# Patient Record
Sex: Male | Born: 1990 | Race: White | Hispanic: No | Marital: Married | Smoking: Never smoker
Health system: Southern US, Community
[De-identification: ages and names within clinical notes are randomized; demographics above are authoritative.]

## PROBLEM LIST (undated history)

## (undated) DIAGNOSIS — F909 Attention-deficit hyperactivity disorder, unspecified type: Secondary | ICD-10-CM

## (undated) DIAGNOSIS — G43909 Migraine, unspecified, not intractable, without status migrainosus: Secondary | ICD-10-CM

## (undated) HISTORY — DX: Migraine, unspecified, not intractable, without status migrainosus: G43.909

## (undated) HISTORY — DX: Attention-deficit hyperactivity disorder, unspecified type: F90.9

---

## 2014-06-01 ENCOUNTER — Ambulatory Visit (INDEPENDENT_AMBULATORY_CARE_PROVIDER_SITE_OTHER): Payer: BC Managed Care – PPO | Admitting: Family

## 2014-06-01 ENCOUNTER — Encounter: Payer: Self-pay | Admitting: Family

## 2014-06-01 VITALS — BP 110/82 | HR 69 | Temp 97.7°F | Resp 18 | Ht 75.0 in | Wt 252.8 lb

## 2014-06-01 DIAGNOSIS — F411 Generalized anxiety disorder: Secondary | ICD-10-CM

## 2014-06-01 DIAGNOSIS — F909 Attention-deficit hyperactivity disorder, unspecified type: Secondary | ICD-10-CM

## 2014-06-01 MED ORDER — AMPHETAMINE-DEXTROAMPHETAMINE 20 MG PO TABS: 20.0000 mg | ORAL_TABLET | Freq: Two times a day (BID) | ORAL | Status: DC

## 2014-06-01 NOTE — Assessment & Plan Note (Signed)
Symptoms described by patient consistent with anxiety. Has used several coping mechanisms up to this point. Discussed continuation of lifestyle management and stress reduction. Provided overview of medications for anxiety and potential side effects. Pt will research and decide at next visit. Follow up as needed.

## 2014-06-01 NOTE — Assessment & Plan Note (Addendum)
Discussed risks and benefits of restarting Adderall. Pt wishes to continue. Start Adderall. Discussed controlled substance contract and need to follow up at least every 3 months. Pt in agreement.

## 2014-06-01 NOTE — Progress Notes (Signed)
   Subjective:    Patient ID: Jerome CruzJonathan Brenning, male    DOB: 1991-06-19, 23 y.o.   MRN: [id number]  Chief Complaint  Patient presents with  . Establish Care    ADHD meds refilled?    HPI:  Jerome CruzJonathan Isaacson is a 23 y.o. male who presents today to establish care and discuss ADHD and anxiety   1) ADHD - Diagnosed as a child and reconfirmed in 2011. Previously taking Adderall. Has not taken medication for about 2 years. Has not noticed any additional increase in attention, able to perform work duties without problems. Now looking to go back to school and would like to restart medication for focus. Denies chest pain, shortness of breath or related symptoms. Sleeping and eating well.   2) Anxiety - Both parents have anxiety. Notices father gets anxious about everything. Used to be that way and has learned to cope. Now chews on nails, and when faced with new situations and begins to spiral and does not think straight and either gives up or goes and eat. Has learned to cope by going with the flow. Has some concerns with loss of control.   No Known Allergies  No current outpatient prescriptions on file prior to visit.   No current facility-administered medications on file prior to visit.   Past Medical History  Diagnosis Date  . Migraines   . ADHD (attention deficit hyperactivity disorder)     Diagnosed in 2011    Review of Systems    See HPI   Objective:    BP 110/82 mmHg  Pulse 69  Temp(Src) 97.7 F (36.5 C) (Oral)  Resp 18  Ht 6\' 3"  (1.905 m)  Wt 252 lb 12.8 oz (114.669 kg)  BMI 31.60 kg/m2  SpO2 96% Nursing note and vital signs reviewed.  Physical Exam  Constitutional: He is oriented to person, place, and time. He appears well-developed and well-nourished. No distress.  Cardiovascular: Normal rate, regular rhythm, normal heart sounds and intact distal pulses.   Pulmonary/Chest: Effort normal and breath sounds normal.  Neurological: He is alert and oriented to person, place,  and time.  Skin: Skin is warm and dry.  Psychiatric: He has a normal mood and affect. His behavior is normal. Judgment and thought content normal.       Assessment & Plan:

## 2014-06-01 NOTE — Progress Notes (Signed)
Pre visit review using our clinic review tool, if applicable. No additional management support is needed unless otherwise documented below in the visit note. 

## 2014-06-01 NOTE — Patient Instructions (Signed)
Thank you for choosing [facility].  Summary/Instructions:   Please plan to follow up in about 1 month for a f/u   Please make a time for your physical.  Please research the antidepressant medications and their respected side effects.   Amphetamine; Dextroamphetamine tablets What is this medicine? AMPHETAMINE; DEXTROAMPHETAMINE(am FET a meen; dex troe am FET a meen) is used to treat attention-deficit hyperactivity disorder (ADHD). It may also be used for narcolepsy. Federal law prohibits giving this medicine to any person other than the person for whom it was prescribed. Do not share this medicine with anyone else. This medicine may be used for other purposes; ask your health care provider or pharmacist if you have questions. COMMON BRAND NAME(S): Adderall What should I tell my health care provider before I take this medicine? They need to know if you have any of these conditions: -anxiety or panic attacks -circulation problems in fingers and toes -glaucoma -hardening or blockages of the arteries or heart blood vessels -heart disease or a heart defect -high blood pressure -history of a drug or alcohol abuse problem -history of stroke -kidney disease -liver disease -mental illness -seizures -suicidal thoughts, plans, or attempt; a previous suicide attempt by you or a family member -thyroid disease -Tourette's syndrome -an unusual or allergic reaction to dextroamphetamine, other amphetamines, other medicines, foods, dyes, or preservatives -pregnant or trying to get pregnant -breast-feeding How should I use this medicine? Take this medicine by mouth with a glass of water. Follow the directions on the prescription label. Take your doses at regular intervals. Do not take your medicine more often than directed. Do not suddenly stop your medicine. You must gradually reduce the dose or you may feel withdrawal effects. Ask your doctor or health care professional for advice. Talk  to your pediatrician regarding the use of this medicine in children. Special care may be needed. While this drug may be prescribed for children as young as 3 years for selected conditions, precautions do apply. Overdosage: If you think you have taken too much of this medicine contact a poison control center or emergency room at once. NOTE: This medicine is only for you. Do not share this medicine with others. What if I miss a dose? If you miss a dose, take it as soon as you can. If it is almost time for your next dose, take only that dose. Do not take double or extra doses. What may interact with this medicine? Do not take this medicine with any of the following medications: -certain medicines for migraine headache like almotriptan, eletriptan, frovatriptan, naratriptan, rizatriptan, sumatriptan, zolmitriptan -MAOIS like Carbex, Eldepryl, Marplan, Nardil, and Parnate -meperidine -other stimulant medicines for attention disorders, weight loss, or to stay awake -pimozide -procarbazine This medicine may also interact with the following medications: -acetazolamide -ammonium chloride -antacids -ascorbic acid -atomoxetine -caffeine -certain medicines for blood pressure -certain medicines for depression, anxiety, or psychotic disturbances -certain medicines for seizures like carbamazepine, phenobarbital, phenytoin -certain medicines for stomach problems like cimetidine, famotidine, omeprazole, lansoprazole -cold or allergy medicines -glutamic acid -methenamine -narcotic medicines for pain -norepinephrine -phenothiazines like chlorpromazine, mesoridazine, prochlorperazine, thioridazine -sodium acid phosphate -sodium bicarbonate This list may not describe all possible interactions. Give your health care provider a list of all the medicines, herbs, non-prescription drugs, or dietary supplements you use. Also tell them if you smoke, drink alcohol, or use illegal drugs. Some items may interact  with your medicine. What should I watch for while using this medicine? Visit your doctor or health  care professional for regular checks on your progress. This prescription requires that you follow special procedures with your doctor and pharmacy. You will need to have a new written prescription from your doctor every time you need a refill. This medicine may affect your concentration, or hide signs of tiredness. Until you know how this medicine affects you, do not drive, ride a bicycle, use machinery, or do anything that needs mental alertness. Tell your doctor or health care professional if this medicine loses its effects, or if you feel you need to take more than the prescribed amount. Do not change the dosage without talking to your doctor or health care professional. Decreased appetite is a common side effect when starting this medicine. Eating small, frequent meals or snacks can help. Talk to your doctor if you continue to have poor eating habits. Height and weight growth of a child taking this medicine will be monitored closely. Do not take this medicine close to bedtime. It may prevent you from sleeping. If you are going to need surgery, a MRI, CT scan, or other procedure, tell your doctor that you are taking this medicine. You may need to stop taking this medicine before the procedure. Tell your doctor or healthcare professional right away if you notice unexplained wounds on your fingers and toes while taking this medicine. You should also tell your healthcare provider if you experience numbness or pain, changes in the skin color, or sensitivity to temperature in your fingers or toes. What side effects may I notice from receiving this medicine? Side effects that you should report to your doctor or health care professional as soon as possible: -allergic reactions like skin rash, itching or hives, swelling of the face, lips, or tongue -changes in vision -chest pain or chest tightness -confusion,  trouble speaking or understanding -fast, irregular heartbeat -fingers or toes feel numb, cool, painful -hallucination, loss of contact with reality -high blood pressure -males: prolonged or painful erection -seizures -severe headaches -shortness of breath -suicidal thoughts or other mood changes -trouble walking, dizziness, loss of balance or coordination -uncontrollable head, mouth, neck, arm, or leg movements Side effects that usually do not require medical attention (report to your doctor or health care professional if they continue or are bothersome): -anxious -headache -loss of appetite -nausea, vomiting -trouble sleeping -weight loss This list may not describe all possible side effects. Call your doctor for medical advice about side effects. You may report side effects to FDA at 1-800-FDA-1088. Where should I keep my medicine? Keep out of the reach of children. This medicine can be abused. Keep your medicine in a safe place to protect it from theft. Do not share this medicine with anyone. Selling or giving away this medicine is dangerous and against the law. Store at room temperature between 15 and 30 degrees C (59 and 86 degrees F). Keep container tightly closed. Throw away any unused medicine after the expiration date. NOTE: This sheet is a summary. It may not cover all possible information. If you have questions about this medicine, talk to your doctor, pharmacist, or health care provider.  2015, Elsevier/Gold Standard. (2013-05-27 18:16:55)

## 2014-08-09 ENCOUNTER — Encounter: Payer: Self-pay | Admitting: Family

## 2014-08-09 ENCOUNTER — Other Ambulatory Visit (INDEPENDENT_AMBULATORY_CARE_PROVIDER_SITE_OTHER): Payer: BLUE CROSS/BLUE SHIELD

## 2014-08-09 ENCOUNTER — Ambulatory Visit (INDEPENDENT_AMBULATORY_CARE_PROVIDER_SITE_OTHER): Payer: BLUE CROSS/BLUE SHIELD | Admitting: Family

## 2014-08-09 VITALS — BP 120/82 | HR 74 | Temp 97.4°F | Resp 18 | Ht 75.0 in | Wt 251.0 lb

## 2014-08-09 DIAGNOSIS — Z0000 Encounter for general adult medical examination without abnormal findings: Secondary | ICD-10-CM

## 2014-08-09 DIAGNOSIS — F909 Attention-deficit hyperactivity disorder, unspecified type: Secondary | ICD-10-CM

## 2014-08-09 LAB — CBC
HCT: 48.4 % (ref 39.0–52.0)
Hemoglobin: 16.3 g/dL (ref 13.0–17.0)
MCHC: 33.6 g/dL (ref 30.0–36.0)
MCV: 87.9 fl (ref 78.0–100.0)
Platelets: 302 10*3/uL (ref 150.0–400.0)
RBC: 5.5 Mil/uL (ref 4.22–5.81)
RDW: 13 % (ref 11.5–15.5)
WBC: 5.5 10*3/uL (ref 4.0–10.5)

## 2014-08-09 LAB — TSH: TSH: 1.37 u[IU]/mL (ref 0.35–4.50)

## 2014-08-09 LAB — BASIC METABOLIC PANEL
BUN: 14 mg/dL (ref 6–23)
CO2: 26 meq/L (ref 19–32)
CREATININE: 1 mg/dL (ref 0.40–1.50)
Calcium: 9.6 mg/dL (ref 8.4–10.5)
Chloride: 104 mEq/L (ref 96–112)
GFR: 97.39 mL/min (ref >60.00)
Glucose, Bld: 91 mg/dL (ref 70–99)
Potassium: 4.3 mEq/L (ref 3.5–5.1)
SODIUM: 140 meq/L (ref 135–145)

## 2014-08-09 LAB — LIPID PANEL
Cholesterol: 160 mg/dL (ref 0–200)
HDL: 45.9 mg/dL (ref >39.00)
LDL Cholesterol: 99 mg/dL (ref 0–99)
NonHDL: 114.1
Total CHOL/HDL Ratio: 3
Triglycerides: 74 mg/dL (ref 0.0–149.0)
VLDL: 14.8 mg/dL (ref 0.0–40.0)

## 2014-08-09 MED ORDER — AMPHETAMINE-DEXTROAMPHETAMINE 20 MG PO TABS
20.0000 mg | ORAL_TABLET | Freq: Two times a day (BID) | ORAL | Status: DC
Start: 2014-08-09 — End: 2014-10-11

## 2014-08-09 MED ORDER — DULOXETINE HCL 30 MG PO CPEP: 30.0000 mg | ORAL_CAPSULE | Freq: Every day | ORAL | Status: DC

## 2014-08-09 NOTE — Progress Notes (Signed)
Subjective:    Patient ID: Jerome Ramos, male    DOB: 1991-06-16, 24 y.o.   MRN: [id number]  Chief Complaint  Patient presents with  . CPE    not fasting    HPI:  Jerome Ramos is a 24 y.o. male who presents today for an annual wellness visit.   1) Health Maintenance - Indicates he feels good and has been working out more.   Diet - Eating 2-3 meals per day which varies; Indicates he eats a variety fruits, vegetables and lean meats.   Exercise - Has been resistance training and cross-fit like exercise; Averaging about 6 times per week for about an hour at a time.   2) Preventative Exams / Immunizations:  Dental -- Due for exam  Vision -- Up to date  Health Maintenance  Topic Date Due  . INFLUENZA VACCINE  02/25/2014  . TETANUS/TDAP  08/09/2018    There is no immunization history on file for this patient.  No Known Allergies  Current Outpatient Prescriptions on File Prior to Visit  Medication Sig Dispense Refill  . amphetamine-dextroamphetamine (ADDERALL) 20 MG tablet Take 1 tablet (20 mg total) by mouth 2 (two) times daily. 60 tablet 0   No current facility-administered medications on file prior to visit.    Past Medical History  Diagnosis Date  . Migraines   . ADHD (attention deficit hyperactivity disorder)     Diagnosed in 2011     Family History  Problem Relation Age of Onset  . Diabetes Mother     History   Social History  . Marital Status: Married    Spouse Name: N/A    Number of Children: 0  . Years of Education: 13   Occupational History  . Sales Specialist    Social History Main Topics  . Smoking status: Never Smoker   . Smokeless tobacco: Never Used  . Alcohol Use: 0.0 oz/week    0 Not specified per week     Comment: Occasional  . Drug Use: No  . Sexual Activity: Yes    Birth Control/ Protection: Inserts   Other Topics Concern  . Not on file   Social History Narrative   [place] in [place] Currently renting a house  with his wife [name]). 3 cats. Fun: Play games, workout, watch TV.    Denies religious beliefs that would effect healthcare.          Review of Systems  Constitutional: Denies fever, chills, fatigue, or significant weight gain/loss. HENT: Head: Denies headache or neck pain Ears: Denies changes in hearing, ringing in ears, earache, drainage Nose: Denies discharge, stuffiness, itching, nosebleed, sinus pain Throat: Denies sore throat, hoarseness, dry mouth, sores, thrush Eyes: Denies loss/changes in vision, pain, redness, blurry/double vision, flashing lights Cardiovascular: Denies chest pain/discomfort, tightness, palpitations, shortness of breath with activity, difficulty lying down, swelling, sudden awakening with shortness of breath Respiratory: Denies shortness of breath, cough, sputum production, wheezing Gastrointestinal: Denies dysphasia, heartburn, change in appetite, nausea, change in bowel habits, rectal bleeding, constipation, diarrhea, yellow skin or eyes Genitourinary: Denies frequency, urgency, burning/pain, blood in urine, incontinence, change in urinary strength. Musculoskeletal: Denies muscle/joint pain, stiffness, back pain, redness or swelling of joints, trauma Skin: Denies rashes, lumps, itching, dryness, color changes, or hair/nail changes Neurological: Denies dizziness, fainting, seizures, weakness, numbness, tingling, tremor Psychiatric - Denies nervousness, stress, depression or memory loss Endocrine: Denies heat or cold intolerance, sweating, frequent urination, excessive thirst, changes in appetite Hematologic: Denies ease  of bruising or bleeding     Objective:    BP 120/82 mmHg  Pulse 74  Temp(Src) 97.4 F (36.3 C) (Oral)  Resp 18  Ht 6\' 3"  (1.905 m)  Wt 251 lb (113.853 kg)  BMI 31.37 kg/m2  SpO2 97% Nursing note and vital signs reviewed.  Physical Exam  Constitutional: He is oriented to person, place, and time. He appears well-developed and  well-nourished.  HENT:  Head: Normocephalic.  Right Ear: Hearing, tympanic membrane, external ear and ear canal normal.  Left Ear: Hearing, tympanic membrane, external ear and ear canal normal.  Nose: Nose normal.  Mouth/Throat: Uvula is midline, oropharynx is clear and moist and mucous membranes are normal.  Eyes: Conjunctivae and EOM are normal. Pupils are equal, round, and reactive to light.  Neck: Neck supple. No JVD present. No tracheal deviation present. No thyromegaly present.  Cardiovascular: Normal rate, regular rhythm, normal heart sounds and intact distal pulses.   Pulmonary/Chest: Effort normal and breath sounds normal.  Abdominal: Soft. Bowel sounds are normal. He exhibits no distension and no mass. There is no tenderness. There is no rebound and no guarding.  Musculoskeletal: Normal range of motion. He exhibits no edema or tenderness.  Lymphadenopathy:    He has no cervical adenopathy.  Neurological: He is alert and oriented to person, place, and time. He has normal reflexes. No cranial nerve deficit. He exhibits normal muscle tone. Coordination normal.  Skin: Skin is warm and dry.  Psychiatric: He has a normal mood and affect. His behavior is normal. Judgment and thought content normal.       Assessment & Plan:

## 2014-08-09 NOTE — Patient Instructions (Addendum)
Thank you for choosing [facility].  Summary/Instructions:   Please stop by the lab on the basement level of the building for your blood work. Your results will be released to MyChart (or called to you) after review, usually within 72 hours after test completion. If any changes need to be made, you will be notified at that same time.  Health Maintenance A healthy lifestyle and preventative care can promote health and wellness.  Maintain regular health, dental, and eye exams.  Eat a healthy diet. Foods like vegetables, fruits, whole grains, low-fat dairy products, and lean protein foods contain the nutrients you need and are low in calories. Decrease your intake of foods high in solid fats, added sugars, and salt. Get information about a proper diet from your health care provider, if necessary.  Regular physical exercise is one of the most important things you can do for your health. Most adults should get at least 150 minutes of moderate-intensity exercise (any activity that increases your heart rate and causes you to sweat) each week. In addition, most adults need muscle-strengthening exercises on 2 or more days a week.   Maintain a healthy weight. The body mass index (BMI) is a screening tool to identify possible weight problems. It provides an estimate of body fat based on height and weight. Your health care provider can find your BMI and can help you achieve or maintain a healthy weight. For males 20 years and older:  A BMI below 18.5 is considered underweight.  A BMI of 18.5 to 24.9 is normal.  A BMI of 25 to 29.9 is considered overweight.  A BMI of 30 and above is considered obese.  Maintain normal blood lipids and cholesterol by exercising and minimizing your intake of saturated fat. Eat a balanced diet with plenty of fruits and vegetables. Blood tests for lipids and cholesterol should begin at age 20 and be repeated every 5 years. If your lipid or cholesterol levels are  high, you are over age 50, or you are at high risk for heart disease, you may need your cholesterol levels checked more frequently.Ongoing high lipid and cholesterol levels should be treated with medicines if diet and exercise are not working.  If you smoke, find out from your health care provider how to quit. If you do not use tobacco, do not start.  Lung cancer screening is recommended for adults aged 55-80 years who are at high risk for developing lung cancer because of a history of smoking. A yearly low-dose CT scan of the lungs is recommended for people who have at least a 30-pack-year history of smoking and are current smokers or have quit within the past 15 years. A pack year of smoking is smoking an average of 1 pack of cigarettes a day for 1 year (for example, a 30-pack-year history of smoking could mean smoking 1 pack a day for 30 years or 2 packs a day for 15 years). Yearly screening should continue until the smoker has stopped smoking for at least 15 years. Yearly screening should be stopped for people who develop a health problem that would prevent them from having lung cancer treatment.  If you choose to drink alcohol, do not have more than 2 drinks per day. One drink is considered to be 12 oz (360 mL) of beer, 5 oz (150 mL) of wine, or 1.5 oz (45 mL) of liquor.  Avoid the use of street drugs. Do not share needles with anyone. Ask for help if you need   support or instructions about stopping the use of drugs.  High blood pressure causes heart disease and increases the risk of stroke. Blood pressure should be checked at least every 1-2 years. Ongoing high blood pressure should be treated with medicines if weight loss and exercise are not effective.  If you are 45-79 years old, ask your health care provider if you should take aspirin to prevent heart disease.  Diabetes screening involves taking a blood sample to check your fasting blood sugar level. This should be done once every 3 years  after age 45 if you are at a normal weight and without risk factors for diabetes. Testing should be considered at a younger age or be carried out more frequently if you are overweight and have at least 1 risk factor for diabetes.  Colorectal cancer can be detected and often prevented. Most routine colorectal cancer screening begins at the age of 50 and continues through age 75. However, your health care provider may recommend screening at an earlier age if you have risk factors for colon cancer. On a yearly basis, your health care provider may provide home test kits to check for hidden blood in the stool. A small camera at the end of a tube may be used to directly examine the colon (sigmoidoscopy or colonoscopy) to detect the earliest forms of colorectal cancer. Talk to your health care provider about this at age 50 when routine screening begins. A direct exam of the colon should be repeated every 5-10 years through age 75, unless early forms of precancerous polyps or small growths are found.  People who are at an increased risk for hepatitis B should be screened for this virus. You are considered at high risk for hepatitis B if:  You were born in a country where hepatitis B occurs often. Talk with your health care provider about which countries are considered high risk.  Your parents were born in a high-risk country and you have not received a shot to protect against hepatitis B (hepatitis B vaccine).  You have HIV or AIDS.  You use needles to inject street drugs.  You live with, or have sex with, someone who has hepatitis B.  You are a man who has sex with other men (MSM).  You get hemodialysis treatment.  You take certain medicines for conditions like cancer, organ transplantation, and autoimmune conditions.  Hepatitis C blood testing is recommended for all people born from 1945 through 1965 and any individual with known risk factors for hepatitis C.  Healthy men should no longer receive  prostate-specific antigen (PSA) blood tests as part of routine cancer screening. Talk to your health care provider about prostate cancer screening.  Testicular cancer screening is not recommended for adolescents or adult males who have no symptoms. Screening includes self-exam, a health care provider exam, and other screening tests. Consult with your health care provider about any symptoms you have or any concerns you have about testicular cancer.  Practice safe sex. Use condoms and avoid high-risk sexual practices to reduce the spread of sexually transmitted infections (STIs).  You should be screened for STIs, including gonorrhea and chlamydia if:  You are sexually active and are younger than 24 years.  You are older than 24 years, and your health care provider tells you that you are at risk for this type of infection.  Your sexual activity has changed since you were last screened, and you are at an increased risk for chlamydia or gonorrhea. Ask your health care   provider if you are at risk.  If you are at risk of being infected with HIV, it is recommended that you take a prescription medicine daily to prevent HIV infection. This is called pre-exposure prophylaxis (PrEP). You are considered at risk if:  You are a man who has sex with other men (MSM).  You are a heterosexual man who is sexually active with multiple partners.  You take drugs by injection.  You are sexually active with a partner who has HIV.  Talk with your health care provider about whether you are at high risk of being infected with HIV. If you choose to begin PrEP, you should first be tested for HIV. You should then be tested every 3 months for as long as you are taking PrEP.  Use sunscreen. Apply sunscreen liberally and repeatedly throughout the day. You should seek shade when your shadow is shorter than you. Protect yourself by wearing long sleeves, pants, a wide-brimmed hat, and sunglasses year round whenever you are  outdoors.  Tell your health care provider of new moles or changes in moles, especially if there is a change in shape or color. Also, tell your health care provider if a mole is larger than the size of a pencil eraser.  A one-time screening for abdominal aortic aneurysm (AAA) and surgical repair of large AAAs by ultrasound is recommended for men aged 65-75 years who are current or former smokers.  Stay current with your vaccines (immunizations). Document Released: 01/10/2008 Document Revised: 07/19/2013 Document Reviewed: 12/09/2010 Exit[employer] Patient Information 2015 [employer]. This information is not intended to replace advice given to you by your health care provider. Make sure you discuss any questions you have with your health care provider.   

## 2014-08-09 NOTE — Progress Notes (Signed)
Pre visit review using our clinic review tool, if applicable. No additional management support is needed unless otherwise documented below in the visit note. 

## 2014-08-09 NOTE — Assessment & Plan Note (Signed)
Patient denies any adverse effects of current Adderall medication. Continue current Adderall prescribed dose. Medication refilled.

## 2014-08-09 NOTE — Assessment & Plan Note (Signed)
1) Anticipatory Guidance: Discussed importance of wearing a seatbelt while driving and not texting while driving; changing batteries in smoke detector at least once annually; wearing suntan lotion when outside; eating a balanced and moderate diet; getting physical activity at least 30 minutes per day.  2) Immunizations / Screenings / Labs:  Patient declined flu shot at this time. All other immunizations are up-to-date per recommendations. I'll screenings are up-to-date per recommendations. Obtain CBC, BMET, Lipid profile and TSH.    Overall well exam. Patient's current risk factors obesity. Patient is currently improving his diet and increasing his physical activity. Goal is for lose approximately 5-10% his current body weight. Continue current healthy lifestyle choices. Follow up prevention exam in one year.

## 2014-08-10 ENCOUNTER — Telehealth: Payer: Self-pay | Admitting: Family

## 2014-08-10 NOTE — Telephone Encounter (Signed)
Tried calling pt. Left message for him to call back.  

## 2014-08-10 NOTE — Telephone Encounter (Signed)
Please inform the patient that his labs are all within the normal limits and everything looks good at this time. Have him keep up the good work!

## 2014-08-11 NOTE — Telephone Encounter (Signed)
Tried calling pt again. No answer. Left VM for him to call back

## 2014-08-14 NOTE — Telephone Encounter (Signed)
Pt aware of result.

## 2014-10-11 ENCOUNTER — Telehealth: Payer: Self-pay | Admitting: Family

## 2014-10-11 MED ORDER — AMPHETAMINE-DEXTROAMPHETAMINE 20 MG PO TABS: 20.0000 mg | ORAL_TABLET | Freq: Two times a day (BID) | ORAL | Status: DC

## 2014-10-11 NOTE — Telephone Encounter (Signed)
Patient will need to be seen for next refill.

## 2014-10-12 NOTE — Telephone Encounter (Signed)
LVM for pt letting them know that rx was ready for pick up.

## 2014-11-15 ENCOUNTER — Telehealth: Payer: Self-pay | Admitting: *Deleted

## 2014-11-15 NOTE — Telephone Encounter (Signed)
Pt never picked up script for Adderal 20 mg dated 10/11/14. Shredded script...Raechel Chute/lmb

## 2014-11-16 ENCOUNTER — Telehealth: Payer: Self-pay | Admitting: Family

## 2014-11-16 NOTE — Telephone Encounter (Signed)
LVM for pt letting him know that he needs an OV in order to get a refill of his adderall.

## 2014-11-16 NOTE — Telephone Encounter (Signed)
He needs an office visit please

## 2014-11-16 NOTE — Telephone Encounter (Signed)
Patient states he requested a refill script of adderall three weeks ago.  He did not get to pick up b/c he went out of town.  He is requesting another refill.  I looked in cabinet.  I did not see a script.  It looks like it has been cleaned out.

## 2014-11-17 ENCOUNTER — Ambulatory Visit: Payer: BLUE CROSS/BLUE SHIELD | Admitting: Family

## 2014-11-17 DIAGNOSIS — Z0289 Encounter for other administrative examinations: Secondary | ICD-10-CM

## 2014-11-28 ENCOUNTER — Ambulatory Visit (INDEPENDENT_AMBULATORY_CARE_PROVIDER_SITE_OTHER): Payer: BLUE CROSS/BLUE SHIELD | Admitting: Family

## 2014-11-28 ENCOUNTER — Encounter: Payer: Self-pay | Admitting: Family

## 2014-11-28 VITALS — BP 102/70 | HR 75 | Temp 97.9°F | Resp 18 | Ht 75.0 in | Wt 231.0 lb

## 2014-11-28 DIAGNOSIS — F909 Attention-deficit hyperactivity disorder, unspecified type: Secondary | ICD-10-CM | POA: Diagnosis not present

## 2014-11-28 MED ORDER — AMPHETAMINE-DEXTROAMPHETAMINE 20 MG PO TABS: 20.0000 mg | ORAL_TABLET | Freq: Two times a day (BID) | ORAL | Status: DC

## 2014-11-28 NOTE — Patient Instructions (Signed)
Thank you for choosing [facility].  Summary/Instructions:  Your prescription(s) have been submitted to your pharmacy or been printed and provided for you. Please take as directed and contact our office if you believe you are having problem(s) with the medication(s) or have any questions.  If your symptoms worsen or fail to improve, please contact our office for further instruction, or in case of emergency go directly to the emergency room at the closest medical facility.     

## 2014-11-28 NOTE — Progress Notes (Signed)
Pre visit review using our clinic review tool, if applicable. No additional management support is needed unless otherwise documented below in the visit note. 

## 2014-11-28 NOTE — Assessment & Plan Note (Signed)
ADHD is stable with current dosage of Adderall. Mild decrease in appetite noted, however patient is attempting to lose weight. No other adverse side effects noted. Refill Adderall 3 months. Follow-up in 3 months.

## 2014-11-28 NOTE — Progress Notes (Signed)
   Subjective:    Patient ID: Jerome Ramos, male    DOB: [birth year], 24 y.o.   MRN: [id number]  Chief Complaint  Patient presents with  . Medication Refill    needs refill of adderall    HPI:  Jerome Ramos is a 24 y.o. male with a PMH of ADHD who presents today for a follow up office visit.   1.) ADHD: Patient previously maintained on 20 mg of Adderall daily. Indicates that his attention is well maintained with the current dosage. Indicates he takes the medication as prescribed. Notes that he does have a slight decrease in appettite, but is eating adequately. Denies any changes to sleep pattern or cardiovascular symptoms  Wt Readings from Last 3 Encounters:  11/28/14 231 lb (104.781 kg)  08/09/14 251 lb (113.853 kg)  06/01/14 252 lb 12.8 oz (114.669 kg)    No Known Allergies   No current outpatient prescriptions on file prior to visit.   No current facility-administered medications on file prior to visit.    Review of Systems  Constitutional: Positive for appetite change. Negative for unexpected weight change.  Respiratory: Negative for chest tightness and shortness of breath.   Cardiovascular: Negative for chest pain, palpitations and leg swelling.  Neurological: Negative for headaches.  Psychiatric/Behavioral: Negative for sleep disturbance and decreased concentration. The patient is not hyperactive.       Objective:    BP 102/70 mmHg  Pulse 75  Temp(Src) 97.9 F (36.6 C) (Oral)  Resp 18  Ht 6\' 3"  (1.905 m)  Wt 231 lb (104.781 kg)  BMI 28.87 kg/m2  SpO2 98% Nursing note and vital signs reviewed.  Physical Exam  Constitutional: He is oriented to person, place, and time. He appears well-developed and well-nourished. No distress.  Cardiovascular: Normal rate, regular rhythm, normal heart sounds and intact distal pulses.   Pulmonary/Chest: Effort normal and breath sounds normal.  Neurological: He is alert and oriented to person, place, and time.  Skin: Skin  is warm and dry.  Psychiatric: He has a normal mood and affect. His behavior is normal. Judgment and thought content normal.       Assessment & Plan:

## 2015-04-03 ENCOUNTER — Emergency Department (HOSPITAL_COMMUNITY)
Admission: EM | Admit: 2015-04-03 | Discharge: 2015-04-04 | Disposition: A | Discharge status: Other Acute Inpatient Hospital | Payer: BLUE CROSS/BLUE SHIELD | Attending: Emergency Medicine | Admitting: Emergency Medicine

## 2015-04-03 ENCOUNTER — Emergency Department (HOSPITAL_COMMUNITY): Payer: BLUE CROSS/BLUE SHIELD

## 2015-04-03 ENCOUNTER — Encounter (HOSPITAL_COMMUNITY): Payer: Self-pay | Admitting: Emergency Medicine

## 2015-04-03 DIAGNOSIS — T1491 Suicide attempt: Secondary | ICD-10-CM | POA: Diagnosis not present

## 2015-04-03 DIAGNOSIS — S3991XA Unspecified injury of abdomen, initial encounter: Secondary | ICD-10-CM | POA: Insufficient documentation

## 2015-04-03 DIAGNOSIS — Y998 Other external cause status: Secondary | ICD-10-CM | POA: Diagnosis not present

## 2015-04-03 DIAGNOSIS — T1491XA Suicide attempt, initial encounter: Secondary | ICD-10-CM

## 2015-04-03 DIAGNOSIS — Z8679 Personal history of other diseases of the circulatory system: Secondary | ICD-10-CM | POA: Diagnosis not present

## 2015-04-03 DIAGNOSIS — Y92410 Unspecified street and highway as the place of occurrence of the external cause: Secondary | ICD-10-CM | POA: Diagnosis not present

## 2015-04-03 DIAGNOSIS — Y9389 Activity, other specified: Secondary | ICD-10-CM | POA: Diagnosis not present

## 2015-04-03 DIAGNOSIS — F909 Attention-deficit hyperactivity disorder, unspecified type: Secondary | ICD-10-CM | POA: Diagnosis not present

## 2015-04-03 LAB — COMPREHENSIVE METABOLIC PANEL
ALK PHOS: 63 U/L (ref 38–126)
ALT: 18 U/L (ref 17–63)
AST: 19 U/L (ref 15–41)
Albumin: 4.1 g/dL (ref 3.5–5.0)
Anion gap: 8 (ref 5–15)
BUN: 8 mg/dL (ref 6–20)
CALCIUM: 9.2 mg/dL (ref 8.9–10.3)
CO2: 24 mmol/L (ref 22–32)
CREATININE: 0.96 mg/dL (ref 0.61–1.24)
Chloride: 107 mmol/L (ref 101–111)
Glucose, Bld: 109 mg/dL — ABNORMAL HIGH (ref 65–99)
Potassium: 3.5 mmol/L (ref 3.5–5.1)
Sodium: 139 mmol/L (ref 135–145)
TOTAL PROTEIN: 6.9 g/dL (ref 6.5–8.1)
Total Bilirubin: 1.6 mg/dL — ABNORMAL HIGH (ref 0.3–1.2)

## 2015-04-03 LAB — CBC WITH DIFFERENTIAL/PLATELET
Basophils Absolute: 0 10*3/uL (ref 0.0–0.1)
Basophils Relative: 0 % (ref 0–1)
EOS PCT: 1 % (ref 0–5)
Eosinophils Absolute: 0 10*3/uL (ref 0.0–0.7)
HCT: 41 % (ref 39.0–52.0)
Hemoglobin: 14.3 g/dL (ref 13.0–17.0)
LYMPHS ABS: 0.9 10*3/uL (ref 0.7–4.0)
Lymphocytes Relative: 12 % (ref 12–46)
MCH: 30 pg (ref 26.0–34.0)
MCHC: 34.9 g/dL (ref 30.0–36.0)
MCV: 86.1 fL (ref 78.0–100.0)
Monocytes Absolute: 0.5 10*3/uL (ref 0.1–1.0)
Monocytes Relative: 7 % (ref 3–12)
Neutro Abs: 6.5 10*3/uL (ref 1.7–7.7)
Neutrophils Relative %: 80 % — ABNORMAL HIGH (ref 43–77)
PLATELETS: 275 10*3/uL (ref 150–400)
RBC: 4.76 MIL/uL (ref 4.22–5.81)
RDW: 12.8 % (ref 11.5–15.5)
WBC: 8 10*3/uL (ref 4.0–10.5)

## 2015-04-03 LAB — RAPID URINE DRUG SCREEN, HOSP PERFORMED
AMPHETAMINES: NOT DETECTED
BARBITURATES: NOT DETECTED
BENZODIAZEPINES: NOT DETECTED
Cocaine: NOT DETECTED
Opiates: NOT DETECTED
Tetrahydrocannabinol: NOT DETECTED

## 2015-04-03 LAB — ETHANOL: Alcohol, Ethyl (B): <5 mg/dL (ref <5)

## 2015-04-03 MED ORDER — ONDANSETRON HCL 4 MG PO TABS: 4.0000 mg | ORAL_TABLET | Freq: Three times a day (TID) | ORAL | Status: DC | PRN

## 2015-04-03 MED ORDER — ACETAMINOPHEN 325 MG PO TABS: 650.0000 mg | ORAL_TABLET | ORAL | Status: DC | PRN

## 2015-04-03 MED ORDER — AMPHETAMINE-DEXTROAMPHETAMINE 10 MG PO TABS: 20.0000 mg | ORAL_TABLET | Freq: Two times a day (BID) | ORAL | Status: DC

## 2015-04-03 MED ORDER — LORAZEPAM 1 MG PO TABS: 1.0000 mg | ORAL_TABLET | Freq: Three times a day (TID) | ORAL | Status: DC | PRN

## 2015-04-03 MED ORDER — IBUPROFEN 400 MG PO TABS
600.0000 mg | ORAL_TABLET | Freq: Three times a day (TID) | ORAL | Status: DC | PRN
  Administered 2015-04-04: 600 mg via ORAL
  Filled 2015-04-03 (×2): qty 1

## 2015-04-03 MED ORDER — IOHEXOL 300 MG/ML  SOLN
100.0000 mL | Freq: Once | INTRAMUSCULAR | Status: AC | PRN
  Administered 2015-04-03: 100 mL via INTRAVENOUS

## 2015-04-03 MED ORDER — ALUM & MAG HYDROXIDE-SIMETH 200-200-20 MG/5ML PO SUSP: 30.0000 mL | ORAL | Status: DC | PRN

## 2015-04-03 NOTE — BH Assessment (Signed)
[facility] Assessment Progress Note    Pt referral faxed to the following facilities for consideration for placement:  [facility]  TTS will continue to seek placement for the pt.  Kristen Delissa Silba, MS, LPC Therapeutic Triage Specialist [facility]   

## 2015-04-03 NOTE — ED Notes (Signed)
Nurse went to room to ask pt what he wanted for dinner.  Pt reports he does not want anything for dinner.  Offered snack, pt declined.  Called dietary and ordered regular tray for patient in case he changes his mind.
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
## 2015-04-03 NOTE — BH Assessment (Signed)
[facility] Assessment Progress Note    Called and scheduled pt's tele assessment with this clinician, spoke with pt's nurse, Clydie Braun, RN, and gathered clinical information on the pt from [facility].  Casimer Lanius, MS, [facility] Therapeutic Triage Specialist [facility]

## 2015-04-03 NOTE — ED Notes (Signed)
Pt watching TV; declined snacks, water given per request
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
## 2015-04-03 NOTE — ED Notes (Signed)
Pt using telephone, speaking pleasantly. Sitter at bedside

## 2015-04-03 NOTE — ED Notes (Signed)
Patient brought over to C20 with sitter at bedside, belongings at the desk in bag. Patient wanded by security at this time. When asked if patient would like anything for dinner, pt denies and states he in fine.

## 2015-04-04 ENCOUNTER — Inpatient Hospital Stay (HOSPITAL_COMMUNITY)
Admission: AD | Admit: 2015-04-04 | Discharge: 2015-04-10 | DRG: 885 | Disposition: A | Discharge status: Home / Self Care | Payer: BLUE CROSS/BLUE SHIELD | Source: Intra-hospital | Attending: Psychiatry | Admitting: Psychiatry

## 2015-04-04 ENCOUNTER — Encounter (HOSPITAL_COMMUNITY): Payer: Self-pay | Admitting: Emergency Medicine

## 2015-04-04 ENCOUNTER — Encounter (HOSPITAL_COMMUNITY): Payer: Self-pay | Admitting: *Deleted

## 2015-04-04 DIAGNOSIS — G4700 Insomnia, unspecified: Secondary | ICD-10-CM | POA: Diagnosis present

## 2015-04-04 DIAGNOSIS — Z833 Family history of diabetes mellitus: Secondary | ICD-10-CM

## 2015-04-04 DIAGNOSIS — F411 Generalized anxiety disorder: Secondary | ICD-10-CM | POA: Diagnosis present

## 2015-04-04 DIAGNOSIS — F410 Panic disorder [episodic paroxysmal anxiety] without agoraphobia: Secondary | ICD-10-CM | POA: Diagnosis present

## 2015-04-04 DIAGNOSIS — R45851 Suicidal ideations: Secondary | ICD-10-CM | POA: Diagnosis present

## 2015-04-04 DIAGNOSIS — F332 Major depressive disorder, recurrent severe without psychotic features: Secondary | ICD-10-CM | POA: Diagnosis present

## 2015-04-04 DIAGNOSIS — F909 Attention-deficit hyperactivity disorder, unspecified type: Secondary | ICD-10-CM | POA: Diagnosis present

## 2015-04-04 DIAGNOSIS — T1491 Suicide attempt: Secondary | ICD-10-CM | POA: Diagnosis not present

## 2015-04-04 DIAGNOSIS — T1491XA Suicide attempt, initial encounter: Secondary | ICD-10-CM | POA: Diagnosis present

## 2015-04-04 DIAGNOSIS — X828XXA Other intentional self-harm by crashing of motor vehicle, initial encounter: Secondary | ICD-10-CM | POA: Diagnosis not present

## 2015-04-04 MED ORDER — MAGNESIUM HYDROXIDE 400 MG/5ML PO SUSP: 30.0000 mL | Freq: Every day | ORAL | Status: DC | PRN

## 2015-04-04 MED ORDER — TRAZODONE HCL 50 MG PO TABS
50.0000 mg | ORAL_TABLET | Freq: Every day | ORAL | Status: DC
  Administered 2015-04-06 – 2015-04-07 (×2): 50 mg via ORAL
  Filled 2015-04-04 (×9): qty 1

## 2015-04-04 MED ORDER — ACETAMINOPHEN 325 MG PO TABS: 650.0000 mg | ORAL_TABLET | Freq: Four times a day (QID) | ORAL | Status: DC | PRN

## 2015-04-04 MED ORDER — ALUM & MAG HYDROXIDE-SIMETH 200-200-20 MG/5ML PO SUSP: 30.0000 mL | ORAL | Status: DC | PRN

## 2015-04-04 MED ORDER — IBUPROFEN 100 MG/5ML PO SUSP
600.0000 mg | Freq: Four times a day (QID) | ORAL | Status: DC | PRN
  Filled 2015-04-04: qty 30

## 2015-04-04 MED ORDER — AMPHETAMINE-DEXTROAMPHETAMINE 10 MG PO TABS: 20.0000 mg | ORAL_TABLET | Freq: Two times a day (BID) | ORAL | Status: DC | PRN

## 2015-04-04 NOTE — Progress Notes (Signed)
Pt did not attend NA group this evening.  
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
## 2015-04-04 NOTE — ED Notes (Signed)
Adderall changed from BID to BID prn d/t states only takes as needed.

## 2015-04-04 NOTE — ED Notes (Signed)
Meal tray at bedside.  
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
## 2015-04-04 NOTE — ED Notes (Signed)
Called BH and gave report to RN on floor.

## 2015-04-04 NOTE — Progress Notes (Signed)
Patient ID: Jerome Ramos, male   DOB: [birth year], 24 y.o.   MRN: [id number]

## 2015-04-04 NOTE — Progress Notes (Addendum)
Pt accepted to [facility] bed 303-2 per Tanna Savoy, by Dr. Dub Mikes. Admission is voluntary.  Ilean Skill, MSW, LCSW Clinical Social Work, Disposition  04/04/2015 [phone]

## 2015-04-04 NOTE — ED Notes (Signed)
All belongings given to Count with [employer].
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
## 2015-04-04 NOTE — ED Notes (Signed)
Pt on phone at nurses' desk. 

## 2015-04-04 NOTE — ED Notes (Signed)
Jeraldine Loots to [employer].   [facility] called to come get patient for transport.

## 2015-04-04 NOTE — ED Notes (Signed)
Voluntary consent signed and faxed to [facility] by Dahlia Client.

## 2015-04-04 NOTE — ED Notes (Signed)
Per Minerva Areola, at [facility], patient will be going to 303 Bed 2.

## 2015-04-04 NOTE — ED Notes (Signed)
PT'S BREAKFAST WARMED AS REQUESTED. PT'S FAMILY X 4 - ARE HERE TO VISIT W/PT INCLUDING SPOUSE. PT ADVISED HE WANTS TO SEE THEM ALL - VOICED UNDERSTANDING THEY MAY VISIT ONE AT A TIME.

## 2015-04-05 ENCOUNTER — Encounter (HOSPITAL_COMMUNITY): Payer: Self-pay | Admitting: Registered Nurse

## 2015-04-05 DIAGNOSIS — T1491 Suicide attempt: Secondary | ICD-10-CM

## 2015-04-05 DIAGNOSIS — R45851 Suicidal ideations: Secondary | ICD-10-CM

## 2015-04-05 DIAGNOSIS — X828XXA Other intentional self-harm by crashing of motor vehicle, initial encounter: Secondary | ICD-10-CM

## 2015-04-05 DIAGNOSIS — F332 Major depressive disorder, recurrent severe without psychotic features: Principal | ICD-10-CM | POA: Diagnosis present

## 2015-04-05 MED ORDER — CITALOPRAM HYDROBROMIDE 20 MG PO TABS
20.0000 mg | ORAL_TABLET | Freq: Every day | ORAL | Status: DC
  Administered 2015-04-05 – 2015-04-10 (×6): 20 mg via ORAL
  Filled 2015-04-05 (×8): qty 1

## 2015-04-05 NOTE — Progress Notes (Signed)
Patient requested to program on 400 hall as he has no substance abuse issues.  NP informed and approved order to be placed.

## 2015-04-05 NOTE — Progress Notes (Signed)
Adult Psychoeducational Group Note  Date:  04/05/2015 Time:  9:03 PM  Group Topic/Focus:  Wrap-Up Group:   The focus of this group is to help patients review their daily goal of treatment and discuss progress on daily workbooks.  Participation Level:  Active  Participation Quality:  Appropriate and Attentive  Affect:  Appropriate  Cognitive:  Appropriate  Insight: Appropriate and Good  Engagement in Group:  Engaged  Modes of Intervention:  Education  Additional Comments:  Patient goal for today was to find good in negative situations. Patient feel as he achieved his goal and found ways to cope with stress.   Jerome Ramos 04/05/2015, 9:03 PM

## 2015-04-05 NOTE — Plan of Care (Signed)
Problem: Consults Goal: Anxiety Disorder Patient Education See Patient Education Module for eduction specifics.  Outcome: Progressing Nurse discussed anxiety/coping skills with patient.        
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
## 2015-04-05 NOTE — Progress Notes (Signed)
D:  Patient's self inventory sheet, patient has fair sleep, sleep medication was not helpful.  Poor appetite, low energy level, good concentration.  Rated depression and hopeless 8, anxiety #6.  Denied withdrawals.  Denied withdrawals.  SI sometimes, contracts for safety.  Denied physical problems.  Goal is to be able to cope with loss and stress and to be healthy.  No discharge plans. A:  Medications administered per MD orders.  Emotional support and encouragement given patient. R:  Denied SI and HI while talking to nurse.  Denied A/V hallucinations.  Denied pain.  Safety maintained with 15 minute checks.

## 2015-04-05 NOTE — BHH Group Notes (Signed)
The focus of this group is to educate the patient on the purpose and policies of crisis stabilization and provide a format to answer questions about their admission.  The group details unit policies and expectations of patients while admitted.  Patient attended 0900 nurse education orientation group this morning.  Patient actively participated and had appropriate affect.  Patient was alert.  Appropriate insight and appropriate engagement.   Today patient will work on 3 goals for discharge.

## 2015-04-05 NOTE — BHH Group Notes (Signed)
B[facility] LCSW Group Therapy  04/05/2015 2:24 PM  Type of Therapy:  Group Therapy  Participation Level:  Active  Participation Quality:  Attentive  Affect:  Appropriate  Cognitive:  Alert and Oriented  Insight:  Engaged  Engagement in Therapy:  Engaged  Modes of Intervention:  Confrontation, Discussion, Education, Exploration, Problem-solving, Rapport Building, Socialization and Support  Summary of Progress/Problems: MHA Speaker came to talk about his personal journey with substance abuse and addiction. The pt processed ways by which to relate to the speaker. MHA speaker provided handouts and educational information pertaining to groups and services offered by the [facility].   Smart, Trang Bouse LCSWA 04/05/2015, 2:24 PM
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
## 2015-04-06 NOTE — Progress Notes (Signed)
D: Pt was laying in bed during the assessment. Pt didn't engage the writer in conversation, except to say that he "thinks of suicide off and on". Pt voiced no questions or concerns.  A:  Support and encouragement was offered. 15 min checks continued for safety.  R: Pt remains safe.

## 2015-04-06 NOTE — Progress Notes (Signed)
[facility] MD Progress Note  04/06/2015 1:22 PM Jaycen Vercher  MRN:  [id number] Subjective:  Patient reports he continues to feel quite depressed and ruminative about his stressors, primarily marital discord , tension. He does state, however, that he is feeling better today than upon admission, and expressed hope that " medication may be starting to work already". He denies medication side effects. Objective : I have discussed case with treatment team and have met with patient. He is  Going to some groups , and is somewhat more visible in milieu. He is still noted to be depressed, sad, ruminative. Tends to focus on his marital issues, he does respond a little better to support, empathy, and his affect seemed more reactive today. He denies suicidal plan or intention and contracts for safety on the unit, but continues to have passive SI. Expresses interest in having a marital/family session with wife , and we have tentatively scheduled one for Monday . No disruptive or self injurious behaviors on the unit.  Principal Problem: MDD (major depressive disorder), recurrent severe, without psychosis Diagnosis:   Patient Active Problem List   Diagnosis Date Noted  . MDD (major depressive disorder), recurrent severe, without psychosis [F33.2] 04/05/2015  . Suicide attempt [T14.91] 04/04/2015  . Routine general medical examination at a health care facility [Z00.00] 08/09/2014  . ADHD (attention deficit hyperactivity disorder) [F90.9] 06/01/2014  . Generalized anxiety disorder [F41.1] 06/01/2014   Total Time spent with patient: 25 minutes    Past Medical History:  Past Medical History  Diagnosis Date  . Migraines   . ADHD (attention deficit hyperactivity disorder)     Diagnosed in 2011    History reviewed. No pertinent past surgical history. Family History:  Family History  Problem Relation Age of Onset  . Diabetes Mother    Social History:  History  Alcohol Use  . 0.0 oz/week  . 0 Standard  drinks or equivalent per week    Comment: Occasional     History  Drug Use No    Social History   Social History  . Marital Status: Married    Spouse Name: N/A  . Number of Children: 0  . Years of Education: 13   Occupational History  . Sales Specialist    Social History Main Topics  . Smoking status: Never Smoker   . Smokeless tobacco: Never Used  . Alcohol Use: 0.0 oz/week    0 Standard drinks or equivalent per week     Comment: Occasional  . Drug Use: No  . Sexual Activity: Yes    Birth Control/ Protection: Inserts   Other Topics Concern  . None   Social History Narrative   Born in [place] and raised in [place]. Currently renting a house with his wife [name]). 3 cats. Fun: Play games, workout, watch TV.    Denies religious beliefs that would effect healthcare.          Additional History:    Sleep: improving   Appetite:  improved    Assessment:   Musculoskeletal: Strength & Muscle Tone: within normal limits Gait & Station: normal Patient leans: N/A   Psychiatric Specialty Exam: Physical Exam  ROS denies chest pain, shortness of breath, vomiting .   Blood pressure 110/61, pulse 96, temperature 97 F (36.1 C), temperature source Oral, resp. rate 16, height 6' 3"  (1.905 m), weight 230 lb (104.327 kg), SpO2 98 %.Body mass index is 28.75 kg/(m^2).  General Appearance: grooming is improved   Eye Contact::  Good  Speech:  Normal Rate  Volume:  Normal  Mood:  Depressed, but somewhat improved compared to admission  Affect:  Constricted, but does smile briefly at times   Thought Process:  Linear  Orientation:  Full (Time, Place, and Person)  Thought Content:  no hallucinations, no delusions, ruminative about marital stressors  Suicidal Thoughts:  Yes.  without intent/plan- today denies any self injurious thoughts, plans or intentions, denies any SI. Contracts for safety on unit .  Homicidal Thoughts:  No  Memory:  recent and remote grossly intact   Judgement:  Fair   Insight:  improving   Psychomotor Activity:  Normal  Concentration:  Good  Recall:  Good  Fund of Knowledge:Good  Language: Good  Akathisia:  Negative  Handed:  Right  AIMS (if indicated):     Assets:  Desire for Improvement Physical Health Resilience  ADL's:   Improved   Cognition: WNL  Sleep:        Current Medications: Current Facility-Administered Medications  Medication Dose Route Frequency Provider Last Rate Last Dose  . acetaminophen (TYLENOL) tablet 650 mg  650 mg Oral Q6H PRN Benjamine Mola, FNP      . alum & mag hydroxide-simeth (MAALOX/MYLANTA) 200-200-20 MG/5ML suspension 30 mL  30 mL Oral Q4H PRN Benjamine Mola, FNP      . citalopram (CELEXA) tablet 20 mg  20 mg Oral Daily Shuvon B Rankin, NP   20 mg at 04/06/15 0820  . ibuprofen (ADVIL,MOTRIN) 100 MG/5ML suspension 600 mg  600 mg Oral Q6H PRN Shuvon B Rankin, NP      . magnesium hydroxide (MILK OF MAGNESIA) suspension 30 mL  30 mL Oral Daily PRN Benjamine Mola, FNP      . traZODone (DESYREL) tablet 50 mg  50 mg Oral QHS Shuvon B Rankin, NP   50 mg at 04/04/15 2200    Lab Results: No results found for this or any previous visit (from the past 48 hour(s)).  Physical Findings: AIMS: Facial and Oral Movements Muscles of Facial Expression: None, normal Lips and Perioral Area: None, normal Jaw: None, normal Tongue: None, normal,Extremity Movements Upper (arms, wrists, hands, fingers): None, normal Lower (legs, knees, ankles, toes): None, normal, Trunk Movements Neck, shoulders, hips: None, normal, Overall Severity Severity of abnormal movements (highest score from questions above): None, normal Incapacitation due to abnormal movements: None, normal Patient's awareness of abnormal movements (rate only patient's report): No Awareness, Dental Status Current problems with teeth and/or dentures?: No Does patient usually wear dentures?: No  CIWA:  CIWA-Ar Total: 1 COWS:  COWS Total Score: 2   Assessment - patient  is somewhat improved compared to admission. He is still significantly depressed and ruminative about losses, stressors, however. He is not actively suicidal and is able to contract for safety. He is tolerating medication well and is describing hope that it is working for him already.  Treatment Plan Summary: Daily contact with patient to assess and evaluate symptoms and progress in treatment, Medication management, Plan inpatient admission and medications as below  Continue Celexa 20 mgrs QDAY to address depression. At patient's request , couples session / meeting with wife tentatively scheduled for Monday to address marital issues  Continue Trazodone 50 mgrs QHS PRN insomnia as needed .  Medical Decision Making:  Established Problem, Stable/Improving (1), Review of Psycho-Social Stressors (1), Review or order clinical lab tests (1) and Review of Medication Regimen & Side Effects (2)     Francessca Friis 04/06/2015, 1:22 PM

## 2015-04-06 NOTE — Progress Notes (Signed)
Adult Psychoeducational Group Note  Date:  04/06/2015 Time:  9:33 PM  Group Topic/Focus:  Wrap-Up Group:   The focus of this group is to help patients review their daily goal of treatment and discuss progress on daily workbooks.  Participation Level:  Active  Participation Quality:  Appropriate and Attentive  Affect:  Appropriate  Cognitive:  Appropriate and Oriented  Insight: Appropriate and Good  Engagement in Group:  Engaged and Supportive  Modes of Intervention:  Education  Additional Comments:  Patient goal for today was how to learn to cope with the fact that his wife need space and learn how to focus on himself.   Jerome Ramos 04/06/2015, 9:33 PM

## 2015-04-06 NOTE — Progress Notes (Signed)
D:  Pt was laying on the floor doing exercises prior to the assessment. Informed the writer that his suicidal thoughts are increased during the day. Stated however, that today they were more fleeting. Pt asked if he would be seeing a Dr tomorrow, because of the weekend. Informed the pt that he would either see a Dr or NP. Pt voiced no other questions or concerns.  A:  Support and encouragement was offered. 15 min checks continued for safety.  R: Pt remains safe.

## 2015-04-06 NOTE — Progress Notes (Signed)
Patient ID: Jerome Ramos, male   DOB: 1990-08-18, 24 y.o.   MRN: [id number]   Pt currently presents with a affect and depressed behavior. Pt forwards little to staff member today. Pt tearful this morning when talking about his wife and his feelings. Pt remains in his bedroom off and on throughout the day when not in groups. Pt affect brighter this afternoon. Pt provided with medications per providers orders. Pt's labs and vitals were monitored throughout the day. Pt supported emotionally and encouraged to express concerns and questions. Pt educated on medications. EKG completed.  Pt's safety ensured with 15 minute and environmental checks. Pt currently denies SI/HI and A/V hallucinations. Pt verbally agrees to seek staff if SI/HI or A/VH occurs and to consult with staff before acting on these thoughts. Will continue POC.

## 2015-04-06 NOTE — [facility] Group Notes (Signed)
   [facility] LCSW Aftercare Discharge Planning Group Note  04/06/2015  8:45 AM   Participation Quality: Alert, Appropriate and Oriented  Mood/Affect: Depressed and Flat  Depression Rating: 9  Anxiety Rating: 10  Thoughts of Suicide: Pt denies SI/HI  Will you contract for safety? Yes  Current AVH: Pt denies  Plan for Discharge/Comments: Pt attended discharge planning group and actively participated in group. CSW provided pt with today's workbook. Patient reports having a safe place to discharge but is uncertain at this time if he will stay in [place]. He is agreeable to referral for outpatient services.  Transportation Means: Pt reports access to transportation  Supports: No supports mentioned at this time  Samuella Bruin, MSW, [employer] Clinical Social Worker [employer] [phone]

## 2015-04-06 NOTE — [facility] Group Notes (Signed)
[facility] LCSW Group Therapy 04/06/2015 1:15 PM Type of Therapy: Group Therapy Participation Level: Minimal  Participation Quality: Attentive  Affect: Depressed and Flat  Cognitive: Alert and Oriented  Insight: Developing/Improving and Engaged  Engagement in Therapy: Developing/Improving and Engaged  Modes of Intervention: Clarification, Confrontation, Discussion, Education, Exploration, Limit-setting, Orientation, Problem-solving, Rapport Building, Reality Testing, Socialization and Support  Summary of Progress/Problems: The topic for today was feelings about relapse. Pt discussed what relapse prevention is to them and identified triggers that they are on the path to relapse. Pt processed their feeling towards relapse and was able to relate to peers. Pt discussed coping skills that can be used for relapse prevention. Patient did not participate in discussion despite CSW encouragement.   Ellanora Rayborn, MSW, LCSWA Clinical Social Worker [facility] [phone]    

## 2015-04-07 NOTE — Progress Notes (Signed)
D:Pt rates depression as a 7 and anxiety as an 8 on 0-10 scale with 10 being the most. Pt is working on giving his wife space and plans to contact a therapist. Pt reports that the si thoughts have left today and he states that it is due to having other options.  A:Offered support, encouragement and 15 minute checks. R:Pt denies si and hi. Safety maintained on the unit.

## 2015-04-07 NOTE — Progress Notes (Signed)
Patient ID: Jerome Ramos, male   DOB: 10/06/1990, 24 y.o.   MRN: [id number] [facility] MD Progress Note  04/07/2015 3:12 PM [name]  MRN:  [id number]   Subjective:  Patient states "The medicine is helping me. I notice I am already more in control of my negative thoughts. I have more hope things can improve. I am thankful I was not hurt in the suicide attempt. I am will to take medication and go to therapy. I have a great deal of work to do on myself. I am trying not to worry so much about my wife. We will have some sort of family meeting on Monday."   Objective : He is going to some groups, and is more visible in milieu. He is still noted to be depressed, sad, ruminative. Less focused on his marital issues, he does respond a little better to support, empathy, and his affect seemed more reactive today. He denies suicidal plan or intention and contracts for safety on the unit, but continues to have passive SI. Expresses interest in having a marital/family session with wife , and we have tentatively scheduled one for Monday .No disruptive or self injurious behaviors on the unit. He is tolerating the Celexa without any reported side effects.   Principal Problem: MDD (major depressive disorder), recurrent severe, without psychosis Diagnosis:   Patient Active Problem List   Diagnosis Date Noted  . MDD (major depressive disorder), recurrent severe, without psychosis [F33.2] 04/05/2015  . Suicide attempt [T14.91] 04/04/2015  . Routine general medical examination at a health care facility [Z00.00] 08/09/2014  . ADHD (attention deficit hyperactivity disorder) [F90.9] 06/01/2014  . Generalized anxiety disorder [F41.1] 06/01/2014   Total Time spent with patient: 25 minutes   Past Medical History:  Past Medical History  Diagnosis Date  . Migraines   . ADHD (attention deficit hyperactivity disorder)     Diagnosed in 2011    History reviewed. No pertinent past surgical history. Family History:  Family  History  Problem Relation Age of Onset  . Diabetes Mother    Social History:  History  Alcohol Use  . 0.0 oz/week  . 0 Standard drinks or equivalent per week    Comment: Occasional     History  Drug Use No    Social History   Social History  . Marital Status: Married    Spouse Name: N/A  . Number of Children: 0  . Years of Education: 13   Occupational History  . Sales Specialist    Social History Main Topics  . Smoking status: Never Smoker   . Smokeless tobacco: Never Used  . Alcohol Use: 0.0 oz/week    0 Standard drinks or equivalent per week     Comment: Occasional  . Drug Use: No  . Sexual Activity: Yes    Birth Control/ Protection: Inserts   Other Topics Concern  . None   Social History Narrative   [place] Currently renting a house with his wife Jerome Ramos). 3 cats. Fun: Play games, workout, watch TV.    Denies religious beliefs that would effect healthcare.          Additional History:    Sleep: improving   Appetite:  improved    Assessment:   Musculoskeletal: Strength & Muscle Tone: within normal limits Gait & Station: normal Patient leans: N/A   Psychiatric Specialty Exam: Physical Exam  Review of Systems  Constitutional: Negative.   HENT: Negative.   Eyes: Negative.  Respiratory: Negative.   Cardiovascular: Negative.   Gastrointestinal: Negative.   Genitourinary: Negative.   Musculoskeletal: Negative.   Skin: Negative.   Neurological: Negative.   Endo/Heme/Allergies: Negative.   Psychiatric/Behavioral: Positive for depression and suicidal ideas. The patient is nervous/anxious and has insomnia.    denies chest pain, shortness of breath, vomiting .   Blood pressure 102/78, pulse 95, temperature 97.7 F (36.5 C), temperature source Oral, resp. rate 16, height 6\' 3"  (1.905 m), weight 104.327 kg (230 lb), SpO2 98 %.Body mass index is 28.75 kg/(m^2).  General Appearance: Casual  Eye Contact::  Good  Speech:  Normal  Rate  Volume:  Normal  Mood:  Depressed, but somewhat improved compared to admission  Affect:  Constricted, but does smile briefly at times   Thought Process:  Linear  Orientation:  Full (Time, Place, and Person)  Thought Content:  no hallucinations, no delusions, ruminative about marital stressors  Suicidal Thoughts:  Yes.  without intent/plan-  denies any self injurious thoughts, plans or intentions, denies any SI. Contracts for safety on unit .  Homicidal Thoughts:  No  Memory:  recent and remote grossly intact   Judgement:  Fair  Insight:  improving   Psychomotor Activity:  Normal  Concentration:  Good  Recall:  Good  Fund of Knowledge:Good  Language: Good  Akathisia:  Negative  Handed:  Right  AIMS (if indicated):     Assets:  Communication Skills Desire for Improvement Physical Health Resilience  ADL's:   Improved   Cognition: WNL  Sleep:  Number of Hours: 6.75     Current Medications: Current Facility-Administered Medications  Medication Dose Route Frequency Provider Last Rate Last Dose  . acetaminophen (TYLENOL) tablet 650 mg  650 mg Oral Q6H PRN Beau Fanny, FNP      . alum & mag hydroxide-simeth (MAALOX/MYLANTA) 200-200-20 MG/5ML suspension 30 mL  30 mL Oral Q4H PRN Beau Fanny, FNP      . citalopram (CELEXA) tablet 20 mg  20 mg Oral Daily Shuvon B Rankin, NP   20 mg at 04/07/15 7829  . ibuprofen (ADVIL,MOTRIN) 100 MG/5ML suspension 600 mg  600 mg Oral Q6H PRN Shuvon B Rankin, NP      . magnesium hydroxide (MILK OF MAGNESIA) suspension 30 mL  30 mL Oral Daily PRN Beau Fanny, FNP      . traZODone (DESYREL) tablet 50 mg  50 mg Oral QHS Shuvon B Rankin, NP   50 mg at 04/06/15 2200    Lab Results: No results found for this or any previous visit (from the past 48 hour(s)).  Physical Findings: AIMS: Facial and Oral Movements Muscles of Facial Expression: None, normal Lips and Perioral Area: None, normal Jaw: None, normal Tongue: None, normal,Extremity  Movements Upper (arms, wrists, hands, fingers): None, normal Lower (legs, knees, ankles, toes): None, normal, Trunk Movements Neck, shoulders, hips: None, normal, Overall Severity Severity of abnormal movements (highest score from questions above): None, normal Incapacitation due to abnormal movements: None, normal Patient's awareness of abnormal movements (rate only patient's report): No Awareness, Dental Status Current problems with teeth and/or dentures?: No Does patient usually wear dentures?: No  CIWA:  CIWA-Ar Total: 1 COWS:  COWS Total Score: 2   Assessment - patient is somewhat improved compared to admission. He is still significantly depressed and ruminative about losses, stressors, however. He is not actively suicidal and is able to contract for safety. He is tolerating medication well and is describing hope that it is  working for him already.  Treatment Plan Summary: Daily contact with patient to assess and evaluate symptoms and progress in treatment, Medication management, Plan inpatient admission and medications as below    Continue Celexa 20 mgrs QDAY to address depression. At patient's request , couples session / meeting with wife tentatively scheduled for Monday to address marital issues  Continue Trazodone 50 mgrs QHS PRN insomnia as needed .  Medical Decision Making:  Established Problem, Stable/Improving (1), Review of Psycho-Social Stressors (1), Review or order clinical lab tests (1) and Review of Medication Regimen & Side Effects (2)  DAVIS, LAURA, NP-C 04/07/2015, 3:12 PM  Reviewed the information documented and agree with the treatment plan.  Libbey Duce,JANARDHAHA R. 04/07/2015 4:50 PM

## 2015-04-07 NOTE — Progress Notes (Signed)
[facility] Group Notes:  (Nursing/MHT/Case Management/Adjunct)  Date:  04/07/2015  Time:  3:15 PM  Type of Therapy:  Nurse Education  Participation Level:  Active  Participation Quality:  Appropriate  Affect:  Depressed  Cognitive:  Alert, Appropriate and Oriented  Insight:  Improving  Engagement in Group:  Engaged  Modes of Intervention:  Discussion, Education, Orientation, Socialization and Support  Summary of Progress/Problems:Pt reports that he is starting to "feel that I matter."  Beatrix Shipper 04/07/2015, 3:15 PM

## 2015-04-07 NOTE — Plan of Care (Signed)
Problem: Alteration in mood; excessive anxiety as evidenced by: Goal: STG-Pt will report an absence of self-harm thoughts/actions (Patient will report an absence of self-harm thoughts or actions)  Outcome: Progressing Pt is denying self harm thoughts today.

## 2015-04-07 NOTE — [facility] Group Notes (Addendum)
[facility] Group Notes:  (Clinical Social Work)  04/07/2015     1:15-2:15PM  Summary of Progress/Problems:   The main focus of today's process group was to contemplate healthy coping skills and how to learn more, not just in this setting, but after leaving the hospital.  Patients listed needs on the whiteboard and unhealthy coping techniques often used to fill needs.  Motivational Interviewing and the whiteboard were utilized to help patients explore in depth the perceived benefits and costs of unhealthy coping techniques, as well as the  benefits and costs of replacing that with a healthy coping skills.  Pt's contributions were insightful and appropriate.  Type of Therapy:  Group Therapy - Process   Participation Level:  Active  Participation Quality:  Appropriate, Attentive, Sharing and Supportive  Affect:  Blunted and Tearful  Cognitive:  Alert, Appropriate and Oriented  Insight:  Engaged  Engagement in Therapy:  Engaged  Modes of Intervention:  Education, Motivational Interviewing  Ambrose Mantle, LCSW 04/07/2015, 4:37 PM

## 2015-04-07 NOTE — Progress Notes (Signed)
Adult Psychoeducational Group Note  Date:  04/07/2015 Time:  9:58 PM  Group Topic/Focus: Psychoeducational   Wrap-Up Group:   The focus of this group is to help patients review their daily goal of treatment and discuss progress on daily workbooks.  Participation Level:  Active  Participation Quality:  Appropriate and Attentive  Affect:  Appropriate  Cognitive:  Alert and Appropriate  Insight: Appropriate  Engagement in Group:  Engaged  Modes of Intervention:  Discussion, Education and Support  Additional Comments:  Patient reported working on being successful after his discharge from [facility].  Patient confirmed working to make himself feel comfortable.     Elmore Guise N 04/07/2015, 9:58 PM

## 2015-04-08 MED ORDER — DIVALPROEX SODIUM 250 MG PO DR TAB
250.0000 mg | DELAYED_RELEASE_TABLET | Freq: Two times a day (BID) | ORAL | Status: DC
  Administered 2015-04-08 – 2015-04-09 (×2): 250 mg via ORAL
  Filled 2015-04-08 (×6): qty 1

## 2015-04-08 NOTE — Progress Notes (Signed)
D: Pt was pleasant today and more talkative than previous days. Informed the writer that he feels "hopeful" that the depakote will help to stabilize his mood.  Pt decided not to take his trazodone stating that if the depakote helps his thought then he should be able to sleep.  A:  Support and encouragement was offered. 15 min checks continued for safety.  R: Pt remains safe.

## 2015-04-08 NOTE — Progress Notes (Signed)
Adult Psychoeducational Group Note  Date:  04/08/2015 Time:  9:49 PM  Group Topic/Focus:  Wrap-Up Group:   The focus of this group is to help patients review their daily goal of treatment and discuss progress on daily workbooks.  Participation Level:  Active  Participation Quality:  Appropriate  Affect:  Appropriate  Cognitive:  Appropriate  Insight: Appropriate  Engagement in Group:  Engaged  Modes of Intervention:  Discussion  Additional Comments:  The patient express that he attended group.The patient also said that the group discission was about support systems.  Octavio Manns 04/08/2015, 9:49 PM

## 2015-04-08 NOTE — Progress Notes (Signed)
D: Pt presents with flat affect and depressed mood. Pt reports decreasing depression 3/10. Hopeless 3/10. Anxiety 6/10. Pt denies suicidal thoughts. Pt reported that he believes that he may have Bipolar, after speaking with his parents and other pts on the unit about his mood and behaviors. Pt requested information about Bipolar. Pt given information about Bipolar from WebMD. Pt compliant with taking meds and attending groups. A: Medications administered as ordered per MD. Verbal support given. Pt encouraged to attend groups. 15 minute checks performed for safety.  R: Pt receptive to tx.

## 2015-04-08 NOTE — Progress Notes (Signed)
  D: Pt states he wants to discuss the possibility of him being  Bipolar. Stated he realizes he "still hasn't addressed the bigger issue". Pt initially refused his trazodone, but later requested it stating that his "mind was all over the place".   A:  Support and encouragement was offered. 15 min checks continued for safety.  R: Pt remains safe.

## 2015-04-08 NOTE — [facility] Group Notes (Signed)
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
## 2015-04-08 NOTE — Progress Notes (Signed)
Patient ID: Jerome Ramos, male   DOB: April 25, 1991, 24 y.o.   MRN: [id number] [facility] MD Progress Note  04/08/2015 2:54 PM Elgie Maziarz  MRN:  [id number]   Subjective:  Patient states "I have done some thinking and talked to family. Since being started on Celexa I have become more activated. I think my Dad may have untreated Bipolar. I have times when I don't need to sleep, feel very energetic, feel smarter than everyone else and have trouble to stop talking. I am actually feeling a bit manic today. I am full of energy. I have noticed this has increased since being started on Celexa."  Objective : Patient seen and chart is reviewed. He is appearing more activated today and is fairly pressured during the assessment. Patient reports reading about Bipolar Disorder and feels that diagnosis may fit him better than unipolar depression. He reports symptoms of mood instability dating back to his early school days with some episodes of minor aggression noted. Patient also talks about feeling angry and irritable when he is not feeling depressed stating "It's like a switch that goes from the floor to the ceiling. I don't know what an even mood is like." Is interested in being started on a mood stabilizing agent. Discussed possibility that Celexa could be inducing mania, which is a potential serious reaction. Patient expressed feeling that Celexa was helping with obsessive thinking and did not want to be taken off. Instructed patient to hold next dose of Celexa tomorrow morning until talking with Dr. Jama Flavors further.   Principal Problem: MDD (major depressive disorder), recurrent severe, without psychosis  Rule out Bipolar Disorder   Diagnosis:   Patient Active Problem List   Diagnosis Date Noted  . MDD (major depressive disorder), recurrent severe, without psychosis [F33.2] 04/05/2015  . Suicide attempt [T14.91] 04/04/2015  . Routine general medical examination at a health care facility [Z00.00] 08/09/2014  .  ADHD (attention deficit hyperactivity disorder) [F90.9] 06/01/2014  . Generalized anxiety disorder [F41.1] 06/01/2014   Total Time spent with patient: 30 minutes  Past Medical History:  Past Medical History  Diagnosis Date  . Migraines   . ADHD (attention deficit hyperactivity disorder)     Diagnosed in 2011    History reviewed. No pertinent past surgical history. Family History:  Family History  Problem Relation Age of Onset  . Diabetes Mother    Social History:  History  Alcohol Use  . 0.0 oz/week  . 0 Standard drinks or equivalent per week    Comment: Occasional     History  Drug Use No    Social History   Social History  . Marital Status: Married    Spouse Name: N/A  . Number of Children: 0  . Years of Education: 13   Occupational History  . Sales Specialist    Social History Main Topics  . Smoking status: Never Smoker   . Smokeless tobacco: Never Used  . Alcohol Use: 0.0 oz/week    0 Standard drinks or equivalent per week     Comment: Occasional  . Drug Use: No  . Sexual Activity: Yes    Birth Control/ Protection: Inserts   Other Topics Concern  . None   Social History Narrative   [place] in [place] Currently renting a house with his wife [name]). 3 cats. Fun: Play games, workout, watch TV.    Denies religious beliefs that would effect healthcare.          Additional History:  Sleep: improving   Appetite:  improved   Musculoskeletal: Strength & Muscle Tone: within normal limits Gait & Station: normal Patient leans: N/A  Psychiatric Specialty Exam: Physical Exam  Review of Systems  Constitutional: Negative.   HENT: Negative.   Eyes: Negative.   Respiratory: Negative.   Cardiovascular: Negative.   Gastrointestinal: Negative.   Genitourinary: Negative.   Musculoskeletal: Negative.   Skin: Negative.   Neurological: Negative.   Endo/Heme/Allergies: Negative.   Psychiatric/Behavioral: Positive for depression. Negative for  suicidal ideas, hallucinations, memory loss and substance abuse. The patient is nervous/anxious and has insomnia.    denies chest pain, shortness of breath, vomiting .   Blood pressure 119/56, pulse 80, temperature 97.8 F (36.6 C), temperature source Oral, resp. rate 20, height  (1.905 m), weight 104.327 kg (230 lb), SpO2 98 %.Body mass index is 28.75 kg/(m^2).  General Appearance: Casual  Eye Contact::  Good  Speech:  Pressured  Volume:  Normal  Mood:  Euphoric,   Affect:  Full Range  Thought Process:  Linear  Orientation:  Full (Time, Place, and Person)  Thought Content:  no hallucinations, no delusions, ruminative about marital stressors  Suicidal Thoughts:  No-  denies any self injurious thoughts, plans or intentions, denies any SI. Contracts for safety on unit .  Homicidal Thoughts:  No  Memory:  recent and remote grossly intact   Judgement:  Fair  Insight:  improving   Psychomotor Activity:  Normal  Concentration:  Fair  Recall:  Good  Fund of Knowledge:Good  Language: Good  Akathisia:  Negative  Handed:  Right  AIMS (if indicated):     Assets:  Communication Skills Desire for Improvement Physical Health Resilience  ADL's:   Improved   Cognition: WNL  Sleep:  Number of Hours: 5.75     Current Medications: Current Facility-Administered Medications  Medication Dose Route Frequency Provider Last Rate Last Dose  . acetaminophen (TYLENOL) tablet 650 mg  650 mg Oral Q6H PRN Beau Fanny, FNP      . alum & mag hydroxide-simeth (MAALOX/MYLANTA) 200-200-20 MG/5ML suspension 30 mL  30 mL Oral Q4H PRN Beau Fanny, FNP      . citalopram (CELEXA) tablet 20 mg  20 mg Oral Daily Shuvon B Rankin, NP   20 mg at 04/08/15 0806  . divalproex (DEPAKOTE) DR tablet 250 mg  250 mg Oral Q12H Thermon Leyland, NP      . ibuprofen (ADVIL,MOTRIN) 100 MG/5ML suspension 600 mg  600 mg Oral Q6H PRN Shuvon B Rankin, NP      . magnesium hydroxide (MILK OF MAGNESIA) suspension 30 mL  30 mL  Oral Daily PRN Beau Fanny, FNP      . traZODone (DESYREL) tablet 50 mg  50 mg Oral QHS Shuvon B Rankin, NP   50 mg at 04/07/15 2256    Lab Results: No results found for this or any previous visit (from the past 48 hour(s)).  Physical Findings: AIMS: Facial and Oral Movements Muscles of Facial Expression: None, normal Lips and Perioral Area: None, normal Jaw: None, normal Tongue: None, normal,Extremity Movements Upper (arms, wrists, hands, fingers): None, normal Lower (legs, knees, ankles, toes): None, normal, Trunk Movements Neck, shoulders, hips: None, normal, Overall Severity Severity of abnormal movements (highest score from questions above): None, normal Incapacitation due to abnormal movements: None, normal Patient's awareness of abnormal movements (rate only patient's report): No Awareness, Dental Status Current problems with teeth and/or dentures?: No Does patient usually wear  dentures?: No  CIWA:  CIWA-Ar Total: 1 COWS:  COWS Total Score: 2   Assessment - patient is somewhat improved compared to admission. He is still significantly depressed and ruminative about losses, stressors, however. He is not actively suicidal and is able to contract for safety. He is tolerating medication well and is describing hope that it is working for him already.  Treatment Plan Summary: Daily contact with patient to assess and evaluate symptoms and progress in treatment, Medication management, Plan inpatient admission and medications as below    Hold Celexa 20 mgrs QDAY to address depression until further evaluation by Dr. Jama Flavors tomorrow Start Depakote DR 250 mg every twelve hours for symptoms of mood lability  At patient's request , couples session / meeting with wife tentatively scheduled for Monday to address marital issues  Continue Trazodone 50 mgrs QHS PRN insomnia as needed .  Medical Decision Making:  Established Problem, Stable/Improving (1), Review of Psycho-Social Stressors (1),  Review or order clinical lab tests (1) and Review of Medication Regimen & Side Effects (2)  DAVIS, LAURA, NP-C 04/08/2015, 2:54 PM  Reviewed the information documented and agree with the treatment plan.  Mikeila Burgen,JANARDHAHA R. 04/08/2015 4:43 PM

## 2015-04-09 MED ORDER — DIVALPROEX SODIUM ER 500 MG PO TB24
500.0000 mg | ORAL_TABLET | Freq: Every day | ORAL | Status: DC
  Administered 2015-04-09: 500 mg via ORAL
  Filled 2015-04-09 (×4): qty 1

## 2015-04-09 NOTE — Progress Notes (Signed)
Recreation Therapy Notes  Date: 09.12.2016 Time: 9:30am Location: [address] Dayroom   Group Topic: Stress Management  Goal Area(s) Addresses:  Patient will actively participate in stress management techniques presented during session.   Behavioral Response: Appropriate   Intervention: Stress management techniques  Activity :  Deep Breathing and Guided Imagery. LRT provided instruction and demonstration on practice of Guided Imagery. Technique was coupled with deep breathing.   Education:  Stress Management, Discharge Planning.   Education Outcome: Acknowledges education  Clinical Observations/Feedback: Patient actively engaged in technique introduced, expressed no concerns and demonstrated ability to practice independently post d/c.   Marykay Lex Mayleigh Tetrault, LRT/CTRS  Okie Jansson L 04/09/2015 11:50 AM

## 2015-04-09 NOTE — [facility] Group Notes (Signed)
[facility] LCSW Aftercare Discharge Planning Group Note  04/09/2015 8:45 AM  Participation Quality: Alert, Appropriate and Oriented  Mood/Affect: Appropriate  Depression Rating: 1  Anxiety Rating: 3  Thoughts of Suicide: Pt denies SI/HI  Will you contract for safety? Yes  Current AVH: Pt denies  Plan for Discharge/Comments: Pt attended discharge planning group and actively participated in group. CSW discussed suicide prevention education with the group and encouraged them to discuss discharge planning and any relevant barriers. Pt expresses a desire to create a discharge plan and reports that he started new medication over the weekend. Pt is concerned about FMLA paperwork  Transportation Means: Pt reports access to transportation  Supports: No supports mentioned at this time  Chad Cordial, LCSWA 04/09/2015 9:25 AM

## 2015-04-09 NOTE — Progress Notes (Signed)
D: Pt presents anxious on approach. Pt reports depression 2/10. Anxiety 3/10. Hopeless 1/10. Pt denies suicidal thoughts. Pt reported that he's in a good place today after taking Depakote this morning and last night. Pt reported good sleep and appetite. Pt stated goal today, "have a healthy meeting with my wife, then contact therapy groups". Per Victorino Dike, RN., pt appeared paranoid and tangential during med administration this morning. Pt compliant with taking meds and attending groups. A: Medications administered as ordered per MD. Verbal support given. Pt encouraged to attend groups. 15 minute checks performed for safety. R: Pt receptive to tx.

## 2015-04-09 NOTE — Progress Notes (Signed)
Adult Psychoeducational Group Note  Date:  04/09/2015 Time:  11:56 PM  Group Topic/Focus:  Wrap-Up Group:   The focus of this group is to help patients review their daily goal of treatment and discuss progress on daily workbooks.  Participation Level:  Active  Participation Quality:  Appropriate  Affect:  Appropriate  Cognitive:  Alert  Insight: Appropriate and Good  Engagement in Group:  Engaged  Modes of Intervention:  Discussion  Additional Comments:  On a scale from 1-10 (1; worst 10; best), patient rated his day as a 3 or 4. Patient stated having a difficult day. A positive thing that happened to day was patient being able to meet with his wife and doctor.  Reighlyn Elmes L Navon Kotowski 04/09/2015, 11:56 PM

## 2015-04-09 NOTE — Progress Notes (Signed)
Patient ID: Jerome Ramos, male   DOB: 1990-10-04, 24 y.o.   MRN: [id number] D: Client reports depression "2" of 10, anxiety "4" of 10. "It's been a rough day" "me and my wife decided to separate for a while" "I'm looking up in general" A: Writer provided emotional support encouraged client to focus on mental well being then hopefully he and wife can get back together. Medications reviewed, administered as ordered. Staff will monitor q78min for safety. R: Client is safe on the unit, attended group.

## 2015-04-09 NOTE — Progress Notes (Signed)
Patient ID: Jerome Ramos, male   DOB: [birth year], 24 y.o.   MRN: [id number] [facility] MD Progress Note  04/09/2015 4:27 PM Leopoldo Mazzie  MRN:  [id number]   Subjective:  Patient reports significant improvement over the weekend , with improved overall mood and a feeling of " my mood being more levelled". He states he has been thinking about psychiatric history issues and feels he has bipolar disorder, as he has had episodes of increased irritability, tendency to have rapid speech, interrupt others, decreased need for sleep, and a tendency to be more impulsive .  He shared these symptoms with weekend provider and has been started on Depakote , which he states has been well tolerated thus far and with which he is feeling better, more stable .   Objective : Patient seen and case discussed with staff. As above, patient reports history suggestive of hypomanic episodes and is now on Depakote , which he is tolerating well thus far. Today we had , at patient 's request , family meeting with his wife . She corroborates that relationship has been tense and difficult due to his mood swings, episodes of depression, but does agree that his mood is labile, and that he has episodes of increased irritability. They describe a tendency for their marriage to become more distant and she has felt she needs to " walk on egg shells " around him at times, so as to not worsen his depression or his irritability. Patient agrees that this is so. She wants a period of separation to allow him to " work on his own issues ". He agrees . Patient has been attending groups and has been interacting well with peers, no disruptive or agitated behaviors . As noted, he feels Depakote has been effective and that he feels much better and more stable now that he is no it. We discussed medication, valproate side effects and precautions . Patient's behavior is calm and in good control and denies any SI or any violent or homicidal ideations towards  wife or anyone else .   Principal Problem: MDD (major depressive disorder), recurrent severe, without psychosis  Rule out Bipolar Disorder   Diagnosis:   Patient Active Problem List   Diagnosis Date Noted  . MDD (major depressive disorder), recurrent severe, without psychosis [F33.2] 04/05/2015  . Suicide attempt [T14.91] 04/04/2015  . Routine general medical examination at a health care facility [Z00.00] 08/09/2014  . ADHD (attention deficit hyperactivity disorder) [F90.9] 06/01/2014  . Generalized anxiety disorder [F41.1] 06/01/2014   Total Time spent with patient: 40 minutes - more that 50 % of session spent on family meeting and disposition planning .   Past Medical History:  Past Medical History  Diagnosis Date  . Migraines   . ADHD (attention deficit hyperactivity disorder)     Diagnosed in 2011    History reviewed. No pertinent past surgical history. Family History:  Family History  Problem Relation Age of Onset  . Diabetes Mother    Social History:  History  Alcohol Use  . 0.0 oz/week  . 0 Standard drinks or equivalent per week    Comment: Occasional     History  Drug Use No    Social History   Social History  . Marital Status: Married    Spouse Name: N/A  . Number of Children: 0  . Years of Education: 13   Occupational History  . Sales Specialist    Social History Main Topics  . Smoking status: Never Smoker   .  Smokeless tobacco: Never Used  . Alcohol Use: 0.0 oz/week    0 Standard drinks or equivalent per week     Comment: Occasional  . Drug Use: No  . Sexual Activity: Yes    Birth Control/ Protection: Inserts   Other Topics Concern  . None   Social History Narrative   [place] Currently renting a house with his wife Jerome Ramos). 3 cats. Fun: Play games, workout, watch TV.    Denies religious beliefs that would effect healthcare.          Additional History:    Sleep: improving   Appetite:  improved    Musculoskeletal: Strength & Muscle Tone: within normal limits Gait & Station: normal Patient leans: N/A  Psychiatric Specialty Exam: Physical Exam  Review of Systems  Constitutional: Negative.   HENT: Negative.   Eyes: Negative.   Respiratory: Negative.   Cardiovascular: Negative.   Gastrointestinal: Negative.   Genitourinary: Negative.   Musculoskeletal: Negative.   Skin: Negative.   Neurological: Negative.   Endo/Heme/Allergies: Negative.   Psychiatric/Behavioral: Positive for depression. Negative for suicidal ideas, hallucinations, memory loss and substance abuse. The patient is nervous/anxious and has insomnia.    denies chest pain, shortness of breath, vomiting .   Blood pressure 105/87, pulse 94, temperature 97.5 F (36.4 C), temperature source Oral, resp. rate 20, height 6\' 3"  (1.905 m), weight 230 lb (104.327 kg), SpO2 98 %.Body mass index is 28.75 kg/(m^2).  General Appearance: Well Groomed  Patent attorney::  Good  Speech:   Normal , not pressured   Volume:  Normal  Mood:  improved, at this time denies severe depression and states he is feeling better ,   Affect:  Appropriate and reactive- not irritable or expansive at this time  Thought Process:  Linear- no flight of ideations  Orientation:  Full (Time, Place, and Person)  Thought Content:  no hallucinations, no delusions, ruminative about marital stressors  Suicidal Thoughts:  No-  denies any self injurious thoughts, plans or intentions, denies any SI. Contracts for safety on unit .  Homicidal Thoughts:  No- as noted, also specifically denies any homicidal or injurious ideations towards wife or the man that she has been interacting with   Memory:  recent and remote grossly intact   Judgement:   Improving   Insight:  improving   Psychomotor Activity:  Normal  Concentration:  Fair  Recall:  Good  Fund of Knowledge:Good  Language: Good  Akathisia:  Negative  Handed:  Right  AIMS (if indicated):     Assets:   Communication Skills Desire for Improvement Physical Health Resilience  ADL's:   Improved   Cognition: WNL  Sleep:  Number of Hours: 6     Current Medications: Current Facility-Administered Medications  Medication Dose Route Frequency Provider Last Rate Last Dose  . acetaminophen (TYLENOL) tablet 650 mg  650 mg Oral Q6H PRN Beau Fanny, FNP      . alum & mag hydroxide-simeth (MAALOX/MYLANTA) 200-200-20 MG/5ML suspension 30 mL  30 mL Oral Q4H PRN Beau Fanny, FNP      . citalopram (CELEXA) tablet 20 mg  20 mg Oral Daily Shuvon B Rankin, NP   20 mg at 04/09/15 0813  . divalproex (DEPAKOTE) DR tablet 250 mg  250 mg Oral Q12H Thermon Leyland, NP   250 mg at 04/09/15 0813  . ibuprofen (ADVIL,MOTRIN) 100 MG/5ML suspension 600 mg  600 mg Oral Q6H PRN Shuvon B Rankin,  NP      . magnesium hydroxide (MILK OF MAGNESIA) suspension 30 mL  30 mL Oral Daily PRN Beau Fanny, FNP      . traZODone (DESYREL) tablet 50 mg  50 mg Oral QHS Shuvon B Rankin, NP   50 mg at 04/07/15 2256    Lab Results: No results found for this or any previous visit (from the past 48 hour(s)).  Physical Findings: AIMS: Facial and Oral Movements Muscles of Facial Expression: None, normal Lips and Perioral Area: None, normal Jaw: None, normal Tongue: None, normal,Extremity Movements Upper (arms, wrists, hands, fingers): None, normal Lower (legs, knees, ankles, toes): None, normal, Trunk Movements Neck, shoulders, hips: None, normal, Overall Severity Severity of abnormal movements (highest score from questions above): None, normal Incapacitation due to abnormal movements: None, normal Patient's awareness of abnormal movements (rate only patient's report): No Awareness, Dental Status Current problems with teeth and/or dentures?: No Does patient usually wear dentures?: No  CIWA:  CIWA-Ar Total: 1 COWS:  COWS Total Score: 2   Assessment - patient is  Significantly improved compared to admission- he is calm, mood is  improved, affect is more reactive, and he denies any SI. Behavior on unit calm. Of note, patient reports he has had periods suggestive of hypomanic episodes and wife corroborates - she came for family meeting and provided collateral information- .  Patient now on Depakote and is tolerating this medication well thus far- at this time mood improved - no severe depression and no current symptoms of mania. Family meeting with wife went well - she wants a period of separation so he can work on his own improvement, focus on himself- he states he is in agreement with this and was not angry or labile during session.  No SI at this time- more future oriented , wanting to return to college for extra study/diploma   Treatment Plan Summary: Daily contact with patient to assess and evaluate symptoms and progress in treatment, Medication management, Plan inpatient admission and medications as below    Continue Celexa 20 mgrs QDAY  To address depression and anxiety symptoms- side effects have been reviewed  Change to  Depakote ER  500 mg QHS for symptoms of mood lability / hypomania. We discussed Depakote side effects at length to include serious side effects such as hepatitis, blood dyscrasias.  Continue Trazodone 50 mgrs QHS PRN insomnia as needed . Consider discharge soon as he continues to improve . Patient plans to return to his home as his wife is moving out at this time.   Medical Decision Making:  Established Problem, Stable/Improving (1), Review of Psycho-Social Stressors (1), Review or order clinical lab tests (1) and Review of Medication Regimen & Side Effects (2)  Nehemiah Massed, MD  04/09/2015, 4:27 PM  04/09/2015 4:27 PM

## 2015-04-09 NOTE — [facility] Group Notes (Signed)
[facility] LCSW Group Therapy  04/09/2015 1:15pm  Type of Therapy:  Group Therapy vercoming Obstacles  Participation Level:  Minimal  Participation Quality:  Reserved  Affect:  Appropriate  Cognitive:  Appropriate and Oriented  Insight:  Developing/Improving and Improving  Engagement in Therapy:  Improving  Modes of Intervention:  Discussion, Exploration, Problem-solving and Support  Description of Group:   In this group patients will be encouraged to explore what they see as obstacles to their own wellness and recovery. They will be guided to discuss their thoughts, feelings, and behaviors related to these obstacles. The group will process together ways to cope with barriers, with attention given to specific choices patients can make. Each patient will be challenged to identify changes they are motivated to make in order to overcome their obstacles. This group will be process-oriented, with patients participating in exploration of their own experiences as well as giving and receiving support and challenge from other group members.  Summary of Patient Progress: Pt participated minimally in group therapy, reserved in group discussion. However, Pt observed to be attentive to conversation.   Therapeutic Modalities:   Cognitive Behavioral Therapy Solution Focused Therapy Motivational Interviewing Relapse Prevention Therapy   Chad Cordial, LCSWA 04/09/2015 3:34 PM

## 2015-04-10 MED ORDER — CITALOPRAM HYDROBROMIDE 20 MG PO TABS: 20.0000 mg | ORAL_TABLET | Freq: Every day | ORAL | Status: AC

## 2015-04-10 MED ORDER — TRAZODONE HCL 50 MG PO TABS: 50.0000 mg | ORAL_TABLET | Freq: Every day | ORAL | Status: AC

## 2015-04-10 MED ORDER — DIVALPROEX SODIUM ER 500 MG PO TB24: 500.0000 mg | ORAL_TABLET | Freq: Every day | ORAL | Status: AC

## 2015-04-10 NOTE — Progress Notes (Signed)
  [facility] Adult Case Management Discharge Plan :  Will you be returning to the same living situation after discharge:  Yes,  Patient plans to return home At discharge, do you have transportation home?: Yes,  patient reports access to transportation Do you have the ability to pay for your medications: Yes,  patient will be provided with prescriptions at discharge  Release of information consent forms completed and in the chart;  Patient's signature needed at discharge.  Patient to Follow up at: Follow-up Information    Follow up with Patient has been provided a listing of local providers. Patient agreeable to scheduling his own follow up due to deposit being required to book appointment..      Patient denies SI/HI: Yes,  denies    Aeronautical engineer and Suicide Prevention discussed: Yes,  with patient and mother  Have you used any form of tobacco in the last 30 days? (Cigarettes, Smokeless Tobacco, Cigars, and/or Pipes): No  Has patient been referred to the Quitline?: N/A patient is not a smoker  Jerome Ramos 04/10/2015, 11:19 AM

## 2015-04-10 NOTE — [facility] Suicide Risk Assessment (Signed)
[facility] INPATIENT:  Family/Significant Other Suicide Prevention Education  Suicide Prevention Education:  Education Completed; Mother Sage Kopera [phone],  (name of family member/significant other) has been identified by the patient as the family member/significant other with whom the patient will be residing, and identified as the person(s) who will aid the patient in the event of a mental health crisis (suicidal ideations/suicide attempt).  With written consent from the patient, the family member/significant other has been provided the following suicide prevention education, prior to the and/or following the discharge of the patient.  The suicide prevention education provided includes the following:  Suicide risk factors  Suicide prevention and interventions  National Suicide Hotline telephone number  [facility] assessment telephone number  [facility] Emergency Assistance 911  [facility] and/or Residential Mobile Crisis Unit telephone number  Request made of family/significant other to:  Remove weapons (e.g., guns, rifles, knives), all items previously/currently identified as safety concern.    Remove drugs/medications (over-the-counter, prescriptions, illicit drugs), all items previously/currently identified as a safety concern.  The family member/significant other verbalizes understanding of the suicide prevention education information provided.  The family member/significant other agrees to remove the items of safety concern listed above.  Ralph Benavidez, [place] 04/10/2015, 11:18 AM

## 2015-04-10 NOTE — [facility] Group Notes (Signed)
[facility] Group Notes:  (Nursing/MHT/Case Management/Adjunct)  Date:  04/10/2015  Time:  0900  Type of Therapy:  Nurse Education - SMART Method of Goal Setting  Participation Level:  Active  Participation Quality:  Appropriate  Affect:  Appropriate  Cognitive:  Alert  Insight:  Improving  Engagement in Group:  Engaged  Modes of Intervention:  Discussion and Education  Summary of Progress/Problems: Patient educated on the SMART method for successful goal setting and attainment. Reports goal for today is to speak with CM regarding OP therapist upon discharge. Patient was unable to identify onger term goal at 3-6 months as he states, "I don't know where I will be. There are lots of unknowns."  Jerome Ramos 04/10/2015, 0930

## 2015-04-10 NOTE — Progress Notes (Signed)
Pt attended spiritual care group on grief and loss facilitated by chaplain Burnis Kingfisher.  Group opened with brief discussion and psycho-social ed around grief and loss in relationships and in relation to self - identifying life patterns, circumstances, changes that cause losses. Established group norm of speaking from own life experience. Group goal of establishing open and affirming space for members to share loss and experience with grief, normalize grief experience and provide psycho social education and grief support.    Jerome Ramos was present and attentive throughout group.  He did not voluntarily engage verbally with facilitator or other group members, but offered comfort by supplying tissues to a tearful group member.    Belva Crome MDiv

## 2015-04-10 NOTE — [facility] Suicide Risk Assessment (Signed)
[facility] Discharge Suicide Risk Assessment   Demographic Factors:  24 year old male, married, employed   Total Time spent with patient: 30 minutes  Musculoskeletal: Strength & Muscle Tone: within normal limits Gait & Station: normal Patient leans: N/A  Psychiatric Specialty Exam: Physical Exam  ROS  Blood pressure 103/59, pulse 88, temperature 98.7 F (37.1 C), temperature source Oral, resp. rate 20, height 6\' 3"  (1.905 m), weight 230 lb (104.327 kg), SpO2 98 %.Body mass index is 28.75 kg/(m^2).  General Appearance: Well Groomed  Eye Contact::  Good  Speech:  Normal Rate409  Volume:  Normal  Mood:  improved, denies depressiion today  Affect:  Appropriate and reactive  Thought Process:  Linear  Orientation:  Full (Time, Place, and Person)  Thought Content:  denies hallucinations, no delusions, not internally preoccupied, less ruminative about stressors, states he feels more optimistic   Suicidal Thoughts:  No denies any current suicidal ideations, denies any thoughts of hurting self   Homicidal Thoughts:  No- denies any thoughts of violence towards wife or towards the man she has reported having a relationship with   Memory:  recent and remote grossly intact   Judgement:  Good  Insight:  Good  Psychomotor Activity:  normal   Concentration:  Good  Recall:  Good  Fund of Knowledge:Good  Language: Good  Akathisia:  Negative  Handed:  Right  AIMS (if indicated):     Assets:  Communication Skills Desire for Improvement Housing Physical Health Resilience Vocational/Educational  Sleep:  Number of Hours: 6.75  Cognition: WNL  ADL's:  Intact   Have you used any form of tobacco in the last 30 days? (Cigarettes, Smokeless Tobacco, Cigars, and/or Pipes): No  Has this patient used any form of tobacco in the last 30 days? (Cigarettes, Smokeless Tobacco, Cigars, and/or Pipes) No  Mental Status Per Nursing Assessment::   On Admission:  Self-harm thoughts  Current Mental Status by  Physician: At this time patient is fully alert and attentive , well related, pleasant , states mood is better, he is feeling less depressed, affect is reactive, no thought disorder, denies SI or HI, no psychotic symptoms- he is future oriented, and at this time states he intends to focus on work and on further studies .   Loss Factors: Marital discord/ separation  Historical Factors: History of depression  Risk Reduction Factors:   Sense of responsibility to family, Employed and Positive coping skills or problem solving skills  Continued Clinical Symptoms:  As noted, much improved upon discharge. Currently mood improved, affect full in range, no thought disorder, no psychotic symptoms, future oriented . At this time denies any thoughts of hurting self or anyone else and specifically denies any thoughts of violence towards wife or the man she has been developing a relationship with.  Cognitive Features That Contribute To Risk:  No gross cognitive deficits noted upon discharge. Is alert , attentive, and oriented x 3   Suicide Risk:  Mild:  Suicidal ideation of limited frequency, intensity, duration, and specificity.  There are no identifiable plans, no associated intent, mild dysphoria and related symptoms, good self-control (both objective and subjective assessment), few other risk factors, and identifiable protective factors, including available and accessible social support.  Principal Problem: MDD (major depressive disorder), recurrent severe, without psychosis Discharge Diagnoses:  Patient Active Problem List   Diagnosis Date Noted  . MDD (major depressive disorder), recurrent severe, without psychosis [F33.2] 04/05/2015  . Suicide attempt [T14.91] 04/04/2015  . Routine general medical  examination at a health care facility [Z00.00] 08/09/2014  . ADHD (attention deficit hyperactivity disorder) [F90.9] 06/01/2014  . Generalized anxiety disorder [F41.1] 06/01/2014    Follow-up  Information    Follow up with Patient has been provided a listing of local providers. Patient agreeable to scheduling his own follow up due to deposit being required to book appointment..      Plan Of Care/Follow-up recommendations:  Activity:  as tolerated Diet:  Regular Tests:  NA Other:  see below   Is patient on multiple antipsychotic therapies at discharge:  No   Has Patient had three or more failed trials of antipsychotic monotherapy by history:  No  Recommended Plan for Multiple Antipsychotic Therapies: NA   Patient is leaving in good spirits . Plans to follow up for outpatient psychiatric services and for psychotherapy.     COBOS, FERNANDO 04/10/2015, 12:28 PM
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
## 2015-04-10 NOTE — Progress Notes (Addendum)
Discharge Note: Pt received both written and verbal discharge instructions. Pt verbalized understanding of discharge instructions. Pt agreed to f/u appt and med regimen. Pt received prescriptions and belongings. Pt safely left [facility].   Pt left [facility] paper work for Dr. Jama Flavors to complete.

## 2015-05-01 ENCOUNTER — Other Ambulatory Visit (INDEPENDENT_AMBULATORY_CARE_PROVIDER_SITE_OTHER): Payer: BLUE CROSS/BLUE SHIELD

## 2015-05-01 ENCOUNTER — Encounter: Payer: Self-pay | Admitting: Family

## 2015-05-01 ENCOUNTER — Ambulatory Visit (INDEPENDENT_AMBULATORY_CARE_PROVIDER_SITE_OTHER): Payer: BLUE CROSS/BLUE SHIELD | Admitting: Family

## 2015-05-01 VITALS — BP 108/72 | HR 74 | Temp 97.7°F | Wt 216.2 lb

## 2015-05-01 DIAGNOSIS — F319 Bipolar disorder, unspecified: Secondary | ICD-10-CM | POA: Diagnosis not present

## 2015-05-01 DIAGNOSIS — Z5181 Encounter for therapeutic drug level monitoring: Secondary | ICD-10-CM

## 2015-05-01 LAB — CBC
HCT: 44.8 % (ref 39.0–52.0)
HEMOGLOBIN: 15.2 g/dL (ref 13.0–17.0)
MCHC: 33.8 g/dL (ref 30.0–36.0)
MCV: 89 fl (ref 78.0–100.0)
PLATELETS: 288 10*3/uL (ref 150.0–400.0)
RBC: 5.04 Mil/uL (ref 4.22–5.81)
RDW: 13.3 % (ref 11.5–15.5)
WBC: 6.3 10*3/uL (ref 4.0–10.5)

## 2015-05-01 LAB — HEPATIC FUNCTION PANEL
ALBUMIN: 4.4 g/dL (ref 3.5–5.2)
ALK PHOS: 63 U/L (ref 39–117)
ALT: 31 U/L (ref 0–53)
AST: 23 U/L (ref 0–37)
BILIRUBIN DIRECT: 0.2 mg/dL (ref 0.0–0.3)
TOTAL PROTEIN: 7.1 g/dL (ref 6.0–8.3)
Total Bilirubin: 0.8 mg/dL (ref 0.2–1.2)

## 2015-05-01 NOTE — Assessment & Plan Note (Signed)
Managed by psychiatry following a suicide attempt and started on Depakote, citalopram and trazadone. Denies adverse side effects. Obtain Valproic acid, hepatic function and CBC for therapeutic drug monitoring. Continue current doses of trazadone, depakote and citalopram and follow up with psychiatry and psychology as scheduled.

## 2015-05-01 NOTE — Progress Notes (Signed)
   Subjective:    Patient ID: Jerome Ramos, male    DOB: 1991/07/20, 24 y.o.   MRN: [id number]  Chief Complaint  Patient presents with  . Follow-up    Bipolar    HPI:  Jerome Ramos is a 24 y.o. male who  has a past medical history of Migraines and ADHD (attention deficit hyperactivity disorder). and presents today for a follow up office.  1.) Bipolar - Recent suicide attempt and is currently being followed by [facility] and is under the care of Coral Spikes, PA-C for psychiatry and Dr. Beckey Downing for counseling. Recently started on Depakote, celexa and trazadone. Since starting the medication reports his moods have improved and he feels good. No adverse side effects have been noted. Denies suicidal ideations. Reports today for valproic acid level.   No Known Allergies   Current Outpatient Prescriptions on File Prior to Visit  Medication Sig Dispense Refill  . citalopram (CELEXA) 20 MG tablet Take 1 tablet (20 mg total) by mouth daily. 30 tablet 0  . divalproex (DEPAKOTE ER) 500 MG 24 hr tablet Take 1 tablet (500 mg total) by mouth at bedtime. 30 tablet 0  . traZODone (DESYREL) 50 MG tablet Take 1 tablet (50 mg total) by mouth at bedtime. 30 tablet 0   No current facility-administered medications on file prior to visit.     History reviewed. No pertinent past surgical history.    Review of Systems  Psychiatric/Behavioral: Negative for suicidal ideas, behavioral problems, sleep disturbance, dysphoric mood and agitation. The patient is not nervous/anxious.       Objective:    BP 108/72 mmHg  Pulse 74  Temp(Src) 97.7 F (36.5 C)  Wt 216 lb 4 oz (98.09 kg)  SpO2 98% Nursing note and vital signs reviewed.  Physical Exam  Constitutional: He is oriented to person, place, and time. He appears well-developed and well-nourished. No distress.  Cardiovascular: Normal rate, regular rhythm, normal heart sounds and intact distal pulses.   Pulmonary/Chest: Effort normal  and breath sounds normal.  Neurological: He is alert and oriented to person, place, and time.  Skin: Skin is warm and dry.  Psychiatric: He has a normal mood and affect. His behavior is normal. Judgment and thought content normal.       Assessment & Plan:   Problem List Items Addressed This Visit      Other   Bipolar I disorder (HCC) - Primary    Managed by psychiatry following a suicide attempt and started on Depakote, citalopram and trazadone. Denies adverse side effects. Obtain Valproic acid, hepatic function and CBC for therapeutic drug monitoring. Continue current doses of trazadone, depakote and citalopram and follow up with psychiatry and psychology as scheduled.       Relevant Orders   Valproic Acid level    Other Visit Diagnoses    Therapeutic drug monitoring        Relevant Orders    Valproic Acid level    CBC    Hepatic function panel

## 2015-05-01 NOTE — Progress Notes (Signed)
Pre visit review using our clinic review tool, if applicable. No additional management support is needed unless otherwise documented below in the visit note. 

## 2015-05-01 NOTE — Patient Instructions (Addendum)
Thank you for choosing [facility].  Summary/Instructions:  Please stop by the lab on the basement level of the building for your blood work. Your results will be released to MyChart (or called to you) after review, usually within 72 hours after test completion. If any changes need to be made, you will be notified at that same time.  If your symptoms worsen or fail to improve, please contact our office for further instruction, or in case of emergency go directly to the emergency room at the closest medical facility.     

## 2015-05-02 ENCOUNTER — Encounter: Payer: Self-pay | Admitting: Family

## 2015-05-02 ENCOUNTER — Telehealth: Payer: Self-pay | Admitting: Family

## 2015-05-02 LAB — VALPROIC ACID LEVEL: Valproic Acid Lvl: 26.6 ug/mL — ABNORMAL LOW (ref 50.0–100.0)

## 2015-05-02 NOTE — Telephone Encounter (Signed)
Paper work has been faxed

## 2015-05-02 NOTE — Telephone Encounter (Signed)
Please fax results to Anne Fu, PA-C at [facility].  Thanks!

## 2015-06-07 ENCOUNTER — Other Ambulatory Visit: Payer: BLUE CROSS/BLUE SHIELD

## 2015-06-07 ENCOUNTER — Other Ambulatory Visit: Payer: Self-pay | Admitting: Family

## 2015-06-07 DIAGNOSIS — F319 Bipolar disorder, unspecified: Secondary | ICD-10-CM

## 2015-06-08 LAB — VALPROIC ACID LEVEL: Valproic Acid Lvl: 44.4 ug/mL — ABNORMAL LOW (ref 50.0–100.0)

## 2015-06-11 ENCOUNTER — Encounter: Payer: Self-pay | Admitting: Family

## 2015-07-31 ENCOUNTER — Encounter: Payer: Self-pay | Admitting: Family

## 2015-07-31 ENCOUNTER — Other Ambulatory Visit (INDEPENDENT_AMBULATORY_CARE_PROVIDER_SITE_OTHER): Payer: BLUE CROSS/BLUE SHIELD

## 2015-07-31 ENCOUNTER — Ambulatory Visit (INDEPENDENT_AMBULATORY_CARE_PROVIDER_SITE_OTHER): Payer: BLUE CROSS/BLUE SHIELD | Admitting: Family

## 2015-07-31 VITALS — BP 118/80 | HR 62 | Temp 97.7°F | Resp 18 | Ht 75.0 in | Wt 219.8 lb

## 2015-07-31 DIAGNOSIS — F319 Bipolar disorder, unspecified: Secondary | ICD-10-CM | POA: Diagnosis not present

## 2015-07-31 DIAGNOSIS — Z5181 Encounter for therapeutic drug level monitoring: Secondary | ICD-10-CM

## 2015-07-31 DIAGNOSIS — N529 Male erectile dysfunction, unspecified: Secondary | ICD-10-CM | POA: Insufficient documentation

## 2015-07-31 DIAGNOSIS — N522 Drug-induced erectile dysfunction: Secondary | ICD-10-CM

## 2015-07-31 LAB — COMPREHENSIVE METABOLIC PANEL
ALBUMIN: 4.3 g/dL (ref 3.5–5.2)
ALT: 20 U/L (ref 0–53)
AST: 20 U/L (ref 0–37)
Alkaline Phosphatase: 53 U/L (ref 39–117)
BILIRUBIN TOTAL: 1 mg/dL (ref 0.2–1.2)
BUN: 13 mg/dL (ref 6–23)
CALCIUM: 9.4 mg/dL (ref 8.4–10.5)
CHLORIDE: 104 meq/L (ref 96–112)
CO2: 29 mEq/L (ref 19–32)
CREATININE: 0.96 mg/dL (ref 0.40–1.50)
GFR: 101.27 mL/min (ref >60.00)
Glucose, Bld: 86 mg/dL (ref 70–99)
Potassium: 4.1 mEq/L (ref 3.5–5.1)
SODIUM: 140 meq/L (ref 135–145)
Total Protein: 7 g/dL (ref 6.0–8.3)

## 2015-07-31 LAB — CBC
HCT: 47.2 % (ref 39.0–52.0)
Hemoglobin: 15.9 g/dL (ref 13.0–17.0)
MCHC: 33.7 g/dL (ref 30.0–36.0)
MCV: 90.7 fl (ref 78.0–100.0)
PLATELETS: 302 10*3/uL (ref 150.0–400.0)
RBC: 5.2 Mil/uL (ref 4.22–5.81)
RDW: 13.5 % (ref 11.5–15.5)
WBC: 6.1 10*3/uL (ref 4.0–10.5)

## 2015-07-31 MED ORDER — SILDENAFIL CITRATE 100 MG PO TABS: 50.0000 mg | ORAL_TABLET | Freq: Every day | ORAL | Status: AC | PRN

## 2015-07-31 NOTE — Progress Notes (Signed)
   Subjective:    Patient ID: Jerome Ramos, male    DOB: [birth year], 25 y.o.   MRN: [id number]  Chief Complaint  Patient presents with  . Medication Reaction    medication is interfering with sex life    HPI:  Jerome Ramos is a 25 y.o. male who  has a past medical history of Migraines and ADHD (attention deficit hyperactivity disorder). and presents today for a follow up office visit.  This is a new problem. Recently diagnosed with bipolar and started on citalopram and divalproex and has noted the associated symptoms of obtaining and maintaining erections. No previous history of dysfunction prior to starting the medication. Denies any modifying factors that make it worse or treatments in attempts to make it better.  No Known Allergies   Current Outpatient Prescriptions on File Prior to Visit  Medication Sig Dispense Refill  . citalopram (CELEXA) 20 MG tablet Take 1 tablet (20 mg total) by mouth daily. 30 tablet 0  . divalproex (DEPAKOTE ER) 500 MG 24 hr tablet Take 1 tablet (500 mg total) by mouth at bedtime. 30 tablet 0  . traZODone (DESYREL) 50 MG tablet Take 1 tablet (50 mg total) by mouth at bedtime. 30 tablet 0   No current facility-administered medications on file prior to visit.    Review of Systems  Constitutional: Negative for fever and chills.  Respiratory: Negative for chest tightness and shortness of breath.   Cardiovascular: Negative for chest pain, palpitations and leg swelling.  Endocrine: Negative for cold intolerance, heat intolerance, polydipsia, polyphagia and polyuria.  Genitourinary: Negative for dysuria, urgency, frequency, hematuria, discharge, penile swelling and testicular pain.      Objective:    BP 118/80 mmHg  Pulse 62  Temp(Src) 97.7 F (36.5 C) (Oral)  Resp 18  Ht _0  (1.905 m)  Wt 219 lb 12.8 oz (99.701 kg)  BMI 27.47 kg/m2  SpO2 98% Nursing note and vital signs reviewed.  Physical Exam  Constitutional: He is oriented to person,  place, and time. He appears well-developed and well-nourished. No distress.  Cardiovascular: Normal rate, regular rhythm, normal heart sounds and intact distal pulses.   Pulmonary/Chest: Effort normal and breath sounds normal.  Neurological: He is alert and oriented to person, place, and time.  Skin: Skin is warm and dry.  Psychiatric: He has a normal mood and affect. His behavior is normal. Judgment and thought content normal.       Assessment & Plan:   Problem List Items Addressed This Visit      Genitourinary   Erectile dysfunction - Primary    Symptoms of erectile dysfunction most likely medication related. Discussed treatment options with risks and benefits. Start sildenafil. Follow-up pending trial of medication or if symptoms worsen or do not improve.      Relevant Medications   sildenafil (VIAGRA) 100 MG tablet     Other   Bipolar I disorder (HCC)    Bipolar stable with current dosage of Depakote and citalopram. Obtain CBC and complete metabolic panel for therapeutic drug monitoring. Continue current dosage of Depakote and citalopram with management by psychiatry.      Relevant Orders   CBC   Comp Met (CMET)    Other Visit Diagnoses    Encounter for therapeutic drug monitoring        Relevant Orders    CBC    Comp Met (CMET)

## 2015-07-31 NOTE — Progress Notes (Signed)
Pre visit review using our clinic review tool, if applicable. No additional management support is needed unless otherwise documented below in the visit note. 

## 2015-07-31 NOTE — Assessment & Plan Note (Signed)
Symptoms of erectile dysfunction most likely medication related. Discussed treatment options with risks and benefits. Start sildenafil. Follow-up pending trial of medication or if symptoms worsen or do not improve.

## 2015-07-31 NOTE — Assessment & Plan Note (Signed)
Bipolar stable with current dosage of Depakote and citalopram. Obtain CBC and complete metabolic panel for therapeutic drug monitoring. Continue current dosage of Depakote and citalopram with management by psychiatry.

## 2015-07-31 NOTE — Patient Instructions (Signed)
Thank you for choosing [facility].  Summary/Instructions:  Your prescription(s) have been submitted to your pharmacy or been printed and provided for you. Please take as directed and contact our office if you believe you are having problem(s) with the medication(s) or have any questions.  If your symptoms worsen or fail to improve, please contact our office for further instruction, or in case of emergency go directly to the emergency room at the closest medical facility.    Erectile Dysfunction Erectile dysfunction is the inability to get or sustain a good enough erection to have sexual intercourse. Erectile dysfunction may involve:  Inability to get an erection.  Lack of enough hardness to allow penetration.  Loss of the erection before sex is finished.  Premature ejaculation. CAUSES  Certain drugs, such as:  Pain relievers.  Antihistamines.  Antidepressants.  Blood pressure medicines.  Water pills (diuretics).  Ulcer medicines.  Muscle relaxants.  Illegal drugs.  Excessive drinking.  Psychological causes, such as:  Anxiety.  Depression.  Sadness.  Exhaustion.  Performance fear.  Stress.  Physical causes, such as:  Artery problems. This may include diabetes, smoking, liver disease, or atherosclerosis.  High blood pressure.  Hormonal problems, such as low testosterone.  Obesity.  Nerve problems. This may include back or pelvic injuries, diabetes mellitus, multiple sclerosis, or Parkinson disease. SYMPTOMS  Inability to get an erection.  Lack of enough hardness to allow penetration.  Loss of the erection before sex is finished.  Premature ejaculation.  Normal erections at some times, but with frequent unsatisfactory episodes.  Orgasms that are not satisfactory in sensation or frequency.  Low sexual satisfaction in either partner because of erection problems.  A curved penis occurring with erection. The curve may cause pain or  may be too curved to allow for intercourse.  Never having nighttime erections. DIAGNOSIS Your caregiver can often diagnose this condition by:  Performing a physical exam to find other diseases or specific problems with the penis.  Asking you detailed questions about the problem.  Performing blood tests to check for diabetes mellitus or to measure hormone levels.  Performing urine tests to find other underlying health conditions.  Performing an ultrasound exam to check for scarring.  Performing a test to check blood flow to the penis.  Doing a sleep study at home to measure nighttime erections. TREATMENT   You may be prescribed medicines by mouth.  You may be given medicine injections into the penis.  You may be prescribed a vacuum pump with a ring.  Penile implant surgery may be performed. You may receive:  An inflatable implant.  A semirigid implant.  Blood vessel surgery may be performed. HOME CARE INSTRUCTIONS  If you are prescribed oral medicine, you should take the medicine as prescribed. Do not increase the dosage without first discussing it with your physician.  If you are using self-injections, be careful to avoid any veins that are on the surface of the penis. Apply pressure to the injection site for 5 minutes.  If you are using a vacuum pump, make sure you have read the instructions before using it. Discuss any questions with your physician before taking the pump home. SEEK MEDICAL CARE IF:  You experience pain that is not responsive to the pain medicine you have been prescribed.  You experience nausea or vomiting. SEEK IMMEDIATE MEDICAL CARE IF:   When taking oral or injectable medications, you experience an erection that lasts longer than 4 hours. If your physician is unavailable, go to   the nearest emergency room for evaluation. An erection that lasts much longer than 4 hours can result in permanent damage to your penis.  You have pain that is  severe.  You develop redness, severe pain, or severe swelling of your penis.  You have redness spreading up into your groin or lower abdomen.  You are unable to pass your urine.   This information is not intended to replace advice given to you by your health care provider. Make sure you discuss any questions you have with your health care provider.   Document Released: 07/11/2000 Document Revised: 03/16/2013 Document Reviewed: 12/16/2012 Elsevier Interactive Patient Education 2016 [employer]  
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
